# Patient Record
Sex: Female | Born: 1970 | Race: White | Hispanic: No | Smoking: Never smoker
Health system: Southern US, Community
[De-identification: ages and names within clinical notes are randomized; demographics above are authoritative.]

## PROBLEM LIST (undated history)

## (undated) DIAGNOSIS — F419 Anxiety disorder, unspecified: Secondary | ICD-10-CM

## (undated) DIAGNOSIS — M722 Plantar fascial fibromatosis: Secondary | ICD-10-CM

## (undated) DIAGNOSIS — F319 Bipolar disorder, unspecified: Secondary | ICD-10-CM

## (undated) DIAGNOSIS — F329 Major depressive disorder, single episode, unspecified: Secondary | ICD-10-CM

## (undated) DIAGNOSIS — F3130 Bipolar disorder, current episode depressed, mild or moderate severity, unspecified: Secondary | ICD-10-CM

## (undated) DIAGNOSIS — F32A Depression, unspecified: Secondary | ICD-10-CM

## (undated) DIAGNOSIS — Z0000 Encounter for general adult medical examination without abnormal findings: Secondary | ICD-10-CM

## (undated) DIAGNOSIS — T7840XA Allergy, unspecified, initial encounter: Secondary | ICD-10-CM

## (undated) HISTORY — DX: Major depressive disorder, single episode, unspecified: F32.9

## (undated) HISTORY — DX: Bipolar disorder, current episode depressed, mild or moderate severity, unspecified: F31.30

## (undated) HISTORY — DX: Depression, unspecified: F32.A

## (undated) HISTORY — DX: Allergy, unspecified, initial encounter: T78.40XA

## (undated) HISTORY — DX: Plantar fascial fibromatosis: M72.2

## (undated) HISTORY — PX: APPENDECTOMY: SHX54

## (undated) HISTORY — DX: Anxiety disorder, unspecified: F41.9

---
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
## 2011-01-10 ENCOUNTER — Emergency Department (HOSPITAL_COMMUNITY)
Admission: EM | Admit: 2011-01-10 | Discharge: 2011-01-10 | Disposition: A | Discharge status: Home / Self Care | Payer: PRIVATE HEALTH INSURANCE | Attending: Emergency Medicine | Admitting: Emergency Medicine

## 2011-01-10 DIAGNOSIS — F3289 Other specified depressive episodes: Secondary | ICD-10-CM | POA: Insufficient documentation

## 2011-01-10 DIAGNOSIS — R070 Pain in throat: Secondary | ICD-10-CM | POA: Insufficient documentation

## 2011-01-10 DIAGNOSIS — R599 Enlarged lymph nodes, unspecified: Secondary | ICD-10-CM | POA: Insufficient documentation

## 2011-01-10 DIAGNOSIS — F329 Major depressive disorder, single episode, unspecified: Secondary | ICD-10-CM | POA: Insufficient documentation

## 2011-01-10 DIAGNOSIS — K122 Cellulitis and abscess of mouth: Secondary | ICD-10-CM | POA: Insufficient documentation

## 2011-01-10 DIAGNOSIS — Z79899 Other long term (current) drug therapy: Secondary | ICD-10-CM | POA: Insufficient documentation

## 2011-01-10 LAB — RAPID STREP SCREEN (MED CTR MEBANE ONLY): Streptococcus, Group A Screen (Direct): NEGATIVE

## 2011-02-17 ENCOUNTER — Emergency Department (HOSPITAL_COMMUNITY)
Admission: EM | Admit: 2011-02-17 | Discharge: 2011-02-17 | Disposition: A | Discharge status: Other Acute Inpatient Hospital | Payer: PRIVATE HEALTH INSURANCE | Attending: Emergency Medicine | Admitting: Emergency Medicine

## 2011-02-17 ENCOUNTER — Inpatient Hospital Stay (HOSPITAL_COMMUNITY)
Admission: RE | Admit: 2011-02-17 | Discharge: 2011-02-20 | DRG: 885 | Disposition: A | Discharge status: Home / Self Care | Payer: PRIVATE HEALTH INSURANCE | Source: Ambulatory Visit | Attending: Psychiatry | Admitting: Psychiatry

## 2011-02-17 DIAGNOSIS — F1010 Alcohol abuse, uncomplicated: Secondary | ICD-10-CM

## 2011-02-17 DIAGNOSIS — Z9119 Patient's noncompliance with other medical treatment and regimen: Secondary | ICD-10-CM

## 2011-02-17 DIAGNOSIS — R45851 Suicidal ideations: Secondary | ICD-10-CM | POA: Insufficient documentation

## 2011-02-17 DIAGNOSIS — F3289 Other specified depressive episodes: Secondary | ICD-10-CM | POA: Insufficient documentation

## 2011-02-17 DIAGNOSIS — F331 Major depressive disorder, recurrent, moderate: Principal | ICD-10-CM

## 2011-02-17 DIAGNOSIS — J45909 Unspecified asthma, uncomplicated: Secondary | ICD-10-CM

## 2011-02-17 DIAGNOSIS — Z818 Family history of other mental and behavioral disorders: Secondary | ICD-10-CM

## 2011-02-17 DIAGNOSIS — Z91199 Patient's noncompliance with other medical treatment and regimen due to unspecified reason: Secondary | ICD-10-CM

## 2011-02-17 DIAGNOSIS — F329 Major depressive disorder, single episode, unspecified: Secondary | ICD-10-CM | POA: Insufficient documentation

## 2011-02-17 LAB — CBC
Hemoglobin: 14.1 g/dL (ref 12.0–15.0)
MCHC: 34.1 g/dL (ref 30.0–36.0)
RBC: 4.65 MIL/uL (ref 3.87–5.11)

## 2011-02-17 LAB — BASIC METABOLIC PANEL
CO2: 25 mEq/L (ref 19–32)
Calcium: 9.9 mg/dL (ref 8.4–10.5)
GFR calc non Af Amer: >60 mL/min (ref >60)
Sodium: 136 mEq/L (ref 135–145)

## 2011-02-17 LAB — URINALYSIS, ROUTINE W REFLEX MICROSCOPIC
Glucose, UA: NEGATIVE mg/dL
Leukocytes, UA: NEGATIVE
Nitrite: NEGATIVE
Protein, ur: NEGATIVE mg/dL
Urobilinogen, UA: 0.2 mg/dL (ref 0.0–1.0)

## 2011-02-17 LAB — ETHANOL: Alcohol, Ethyl (B): <11 mg/dL (ref 0–11)

## 2011-02-17 LAB — POCT PREGNANCY, URINE: Preg Test, Ur: NEGATIVE

## 2011-02-17 LAB — DIFFERENTIAL
Basophils Absolute: 0.1 10*3/uL (ref 0.0–0.1)
Basophils Relative: 1 % (ref 0–1)
Monocytes Absolute: 0.8 10*3/uL (ref 0.1–1.0)
Neutro Abs: 6.2 10*3/uL (ref 1.7–7.7)
Neutrophils Relative %: 61 % (ref 43–77)

## 2011-02-18 DIAGNOSIS — F329 Major depressive disorder, single episode, unspecified: Secondary | ICD-10-CM

## 2011-02-18 DIAGNOSIS — F1010 Alcohol abuse, uncomplicated: Secondary | ICD-10-CM

## 2011-02-21 NOTE — Discharge Summary (Signed)
NAME:  Selena Chapman, Selena Chapman      ACCOUNT NO.:  [id number]  MEDICAL RECORD NO.:  [id number]  LOCATION:  0306                          FACILITY:  [facility]  PHYSICIAN:  Debbora Lacrosse, MD       DATE OF BIRTH:  Mar 20, 1971  DATE OF ADMISSION:  02/17/2011 DATE OF DISCHARGE:  02/20/2011                              DISCHARGE SUMMARY   REASON FOR ADMISSION:  This is a 40 year old who was voluntarily admitted with an onset of depression, worsening since she was intermittently using medications and had also been using alcohol heavily.  Was drinking more since the anniversary of her sister's death, which was in 2023-01-06, and has been drinking most days.  She states on poor days she would drink 4 or 5 drinks.  She states she did have a problem with alcohol in the past 5 years and was doing well again up until this past year.  Sleep had been erratic, finding no joy in things, and did not feel like her medications were working well, but also reports intermittent compliance.  FINAL DIAGNOSES:  Axis I.  Major depressive disorder, recurrent, moderate.  Alcohol abuse. Axis II.  Deferred. Axis III.  History of asthma. Axis IV.  Problems with psychosocial including support and anniversary of her sister's death from an overdose. Axis V.  50-55.  LABS:  CBC was normal.  Alcohol level was negative.  BMP normal. Urinalysis normal.  Urine pregnancy negative.  Drug screen negative.  COURSE IN HOSPITAL:  Patient was admitted to the inpatient unit on February 17, 2011, and has had improvements during this hospitalization.  She participated well in group and individual treatment, was insightful into her illness.  Initially felt uncomfortable being here, as this was her first hospitalization, but did do well on this inpatient unit.  She resumed consistently taking her Zoloft and Wellbutrin, which had worked for her in the past.  This regimen was discussed with her and she felt that overall it had been successful, if  she would remember to take these daily.  She also discussed her alcohol use and how that had affected her medication compliance.  She has a supportive husband and family, and she is also interested in further treatment in and intensive outpatient setting and some substance treatment as well.  She has previously been to the [facility] and plans to resume treatment there, which is close to her residence.  The patient has improved.  She is sleeping well.  She did take some Vistaril for sleep.  She denies having any suicidal thinking.  No homicidal thinking, no hallucinations or other psychotic symptoms.  Her insight is present.  Judgement is good.  She appears to be stable for discharge to outpatient treatment.  Collateral information was obtained by the counsellor from her husband, who is also quite supportive in her coming home, as he has seen her in the hospital and feels that she is stable as well.  FOLLOWUP PLAN:  The patient will follow up at the [facility] and has an appointment in 2 days.  DISCHARGE MEDICATIONS: 1. Wellbutrin XL 300 mg daily. 2. Sertraline 200 mg daily.  DISCHARGE CONDITION:  Safe, stable, improved.  ______________________________ Debbora Lacrosse, MD     WS/MEDQ  D:  02/20/2011  T:  02/20/2011  Job:  [id number]  Electronically Signed by Andi Devon Alvera Tourigny  on 02/21/2011 08:34:25 AM

## 2011-02-22 NOTE — H&P (Signed)
NAME:  Selena Chapman, Selena Chapman    ACCOUNT NO.:  [id number]  MEDICAL RECORD NO.:  [id number]  LOCATION:                                 FACILITY:  PHYSICIAN:  Debbora Lacrosse, MD       DATE OF BIRTH:  1971-03-15  DATE OF ADMISSION: DATE OF DISCHARGE:                      PSYCHIATRIC ADMISSION ASSESSMENT   This is on a 40 year old female voluntarily admitted February 17, 2011.  HISTORY OF PRESENT ILLNESS:  The patient presents with a long history of depression and alcohol use.  She states that she has been drinking more since the anniversary of her sister's death which was in 2023-01-28. She has been drinking most days,  starting in the afternoon.  She states that on a low day, she will drink four to five,  others she will drink "a lot." She has been drinking fairly steadily for about a year,  did have a problem with alcohol in the past about 5 years ago and was doing well again up until this past year.  Her sleep has been erratic.  She finds no joy in things.  Does not feel like her medications are working well for her,  but she does report some noncompliance as well.  PAST PSYCHIATRIC HISTORY:  First admission to the [facility].  She sees Dr. Donell Beers for outpatient psychiatry,  has been on Lexapro in the past.  SOCIAL HISTORY:  The patient is married, a  40 year old. She has a 56- year-old child.  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  She lives in [place].  FAMILY HISTORY:  Sister committed suicide by overdose 2 years ago.  Her sister was 65 years of age.  Her mother has a history of depression currently on Celexa.  ALCOHOL DRUG HISTORY:  As above.  No history of seizures or blackouts and denies any other substance use.  Her primary care provider is Dr. Nadyne Coombes.  PAST MEDICAL HISTORY:  History of asthma which she states is in control.  MEDICATIONS:  Has been on Wellbutrin 300 mg.  She tapered herself down from 450 as she was feeling very jittery.  Zoloft 200 mg daily which she has been on since January 28, 2008 and Klonopin at bedtime.  Again reporting some recent noncompliance.  DRUG ALLERGIES:  No known allergies.  PHYSICAL EXAMINATION:  GENERAL APPEARANCE:  Physical exam was done in the emergency department.  Normally-developed well-nourished female in no acute distress.  LABORATORY FINDINGS:  Alcohol level less than 11.  CBC within normal limits.  Pregnancy test is negative.  Urinalysis negative.  Drug screen is negative.  BMET within normal limits.  MENTAL STATUS EXAM:  She is fully alert and cooperative, sitting on her bed.  Speech is soft-spoken and she provides a good history to her circumstances and symptoms.  Thought processes are coherent, goal directed.  Denies any suicidal thoughts and feels this is a stressful place for her at this time, asking about going home.  Denies any suicidal or homicidal thoughts, function intact.  Her memory is intact. Judgment insight are fair.  IMPRESSION:  AXIS I:  Major depressive disorder, moderate,  rule out alcohol abuse. AXIS II:  Deferred. AXIS III:  History of asthma. AXIS IV:  Other psychosocial problems. AXIS V:  45.  PLAN:  Our plan is to continue Zoloft and Wellbutrin. We will  address her alcohol use.  The patient would benefit from some individual counseling which she is also in agreement with.  Tentative length of stay 3-four days.     Landry Corporal, N.P.   ______________________________ Debbora Lacrosse, MD    JO/MEDQ  D:  02/18/2011  T:  02/18/2011  Job:  [id number]  Electronically Signed by Limmie PatriciaP. on 02/21/2011 01:45:37 PM Electronically Signed by Andi Devon Angas Isabell  on 02/22/2011 08:16:48 AM

## 2011-09-23 ENCOUNTER — Encounter: Payer: Self-pay | Admitting: Family Medicine

## 2011-09-23 ENCOUNTER — Ambulatory Visit (INDEPENDENT_AMBULATORY_CARE_PROVIDER_SITE_OTHER): Payer: Managed Care, Other (non HMO) | Admitting: Family Medicine

## 2011-09-23 DIAGNOSIS — J45909 Unspecified asthma, uncomplicated: Secondary | ICD-10-CM | POA: Insufficient documentation

## 2011-09-23 DIAGNOSIS — Z23 Encounter for immunization: Secondary | ICD-10-CM

## 2011-09-23 DIAGNOSIS — F329 Major depressive disorder, single episode, unspecified: Secondary | ICD-10-CM | POA: Insufficient documentation

## 2011-09-23 DIAGNOSIS — F32A Depression, unspecified: Secondary | ICD-10-CM | POA: Insufficient documentation

## 2011-09-23 DIAGNOSIS — Z9109 Other allergy status, other than to drugs and biological substances: Secondary | ICD-10-CM

## 2011-12-16 ENCOUNTER — Ambulatory Visit: Payer: Managed Care, Other (non HMO)

## 2011-12-16 ENCOUNTER — Ambulatory Visit (INDEPENDENT_AMBULATORY_CARE_PROVIDER_SITE_OTHER): Payer: Managed Care, Other (non HMO) | Admitting: Family Medicine

## 2011-12-16 VITALS — BP 124/84 | HR 67 | Temp 98.0°F | Resp 20 | Ht 62.0 in | Wt 209.4 lb

## 2011-12-16 DIAGNOSIS — M79609 Pain in unspecified limb: Secondary | ICD-10-CM

## 2011-12-16 DIAGNOSIS — M79646 Pain in unspecified finger(s): Secondary | ICD-10-CM

## 2011-12-16 DIAGNOSIS — S61209A Unspecified open wound of unspecified finger without damage to nail, initial encounter: Secondary | ICD-10-CM

## 2011-12-16 MED ORDER — TETANUS-DIPHTH-ACELL PERTUSSIS 5-2.5-18.5 LF-MCG/0.5 IM SUSP: 0.5000 mL | Freq: Once | INTRAMUSCULAR | Status: DC

## 2011-12-16 NOTE — Patient Instructions (Signed)
Wound Care Wound care helps prevent pain and infection.  You may need a tetanus shot if:  You cannot remember when you had your last tetanus shot.   You have never had a tetanus shot.   The injury broke your skin.  If you need a tetanus shot and you choose not to have one, you may get tetanus. Sickness from tetanus can be serious. HOME CARE   Only take medicine as told by your doctor.   Clean the wound daily with mild soap and water.   Change any bandages (dressings) as told by your doctor.   Put medicated cream and a bandage on the wound as told by your doctor.   Change the bandage if it gets wet, dirty, or starts to smell.   Take showers. Do not take baths, swim, or do anything that puts your wound under water.   Rest and raise (elevate) the wound until the pain and puffiness (swelling) are better.   Keep all doctor visits as told.  GET HELP RIGHT AWAY IF:   Yellowish-white fluid (pus) comes from the wound.   Medicine does not lessen your pain.   There is a red streak going away from the wound.   You cannot move your finger or toe.   You have a fever.  MAKE SURE YOU:   Understand these instructions.   Will watch your condition.   Will get help right away if you are not doing well or get worse.  Document Released: 05/21/2008 Document Revised: 08/01/2011 Document Reviewed: 12/16/2010 ExitCare Patient Information 2012 [employer]. 

## 2011-12-16 NOTE — Progress Notes (Signed)
Subjective: Estimating the patient was closing a window at her house. The heavy old window came down and smashed her right third and fourth fingers. She had to get her husband to help open the window move her fingers. She had abrasions on both fingers. The fourth finger had a couple of abrasions around the DIP joint which were somewhat macerated looking. The middle finger has a C-shaped skin flap fitted peelback but came down nicely into place and looks intact.   Objective: Wounds are fingers as noted above. There is decreased flexion of her DIP joints, consistent with the swelling that has taken place in the fingertips. The right fourth is that he worse of the 2 fingers. Dissection a small abrasion on the index finger also.  Assessment: Wounds of fingertips right hand fingers 2-4.  Plan: X-ray fingers. Cleaned the wounds gently and debrided a little bit of dead skin off the fourth finger. After the x-ray will dress the fingers. Her last tetanus shot was 09/22/05 so will get TDap  [facility] reading (PRIMARY) by  Dr. Mia Creek x-ray normal no fracture seen.  Will dress fingers and given instruction in care.Marland Kitchen

## 2012-01-20 ENCOUNTER — Ambulatory Visit (INDEPENDENT_AMBULATORY_CARE_PROVIDER_SITE_OTHER): Payer: Managed Care, Other (non HMO) | Admitting: Family Medicine

## 2012-01-20 VITALS — BP 118/79 | HR 69 | Temp 98.3°F | Resp 16 | Ht 62.0 in | Wt 208.6 lb

## 2012-01-20 DIAGNOSIS — S0180XA Unspecified open wound of other part of head, initial encounter: Secondary | ICD-10-CM

## 2012-01-20 NOTE — Progress Notes (Signed)
Subjective: Patient bumped her head against some landscaping when she fell yesterday evening. She has a wound above her left eyebrow. Her tetanus shot is up-to-date, just having had it in recent weeks. No loss of consciousness  Objective: 1.5 CM laceration at the end of her left eyebrow, a clean cut.  Assessment: Wound eyebrow  Plan: Laceration repair

## 2012-01-20 NOTE — Progress Notes (Signed)
Verbal consent obtained from the patient.  Local anesthesia with 1cc Lidocaine 2% with epinephrine.  Wound scrubbed with soap and water and rinsed.  Wound closed with 4 5-0 Prolene HM and SI sutures.  Wound cleansed and dressed.

## 2012-01-20 NOTE — Patient Instructions (Signed)

## 2012-01-27 ENCOUNTER — Ambulatory Visit (INDEPENDENT_AMBULATORY_CARE_PROVIDER_SITE_OTHER): Payer: Managed Care, Other (non HMO) | Admitting: Family Medicine

## 2012-01-27 DIAGNOSIS — Z4802 Encounter for removal of sutures: Secondary | ICD-10-CM

## 2012-01-27 NOTE — Patient Instructions (Signed)
Suture Removal  Your caregiver has removed your sutures today. If skin adhesive strips were applied at the time of suturing, or applied following removal of the sutures today, they will begin to peel off in a couple more days. If skin adhesive strips remain after 14 days, they may be removed.  HOME CARE INSTRUCTIONS     Change any bandages (dressings) at least once a day or as directed by your caregiver. If the bandage sticks, soak it off with warm, soapy water.    Wash the area with soap and water to remove all the cream or ointment (if you were instructed to use any) 2 times a day. Rinse off the soap and pat the area dry with a clean towel.    Reapply cream or ointment as directed by your caregiver. This will help prevent infection and keep the bandage from sticking.    Keep the wound area dry and clean. If the bandage becomes wet, dirty, or develops a bad smell, change it as soon as possible.    Only take over-the-counter or prescription medicines for pain, discomfort, or fever as directed by your caregiver.    Use sunscreen when out in the sun. New scars become sunburned easily.    Return to your caregivers office in in 7 days or as directed to have your sutures removed.   You may need a tetanus shot if:   You cannot remember when you had your last tetanus shot.    You have never had a tetanus shot.    The injury broke your skin.   If you got a tetanus shot, your arm may swell, get red, and feel warm to the touch. This is common and not a problem. If you need a tetanus shot and you choose not to have one, there is a rare chance of getting tetanus. Sickness from tetanus can be serious.  SEEK IMMEDIATE MEDICAL CARE IF:     There is redness, swelling, or increasing pain in the wound.    Pus is coming from the wound.    An unexplained oral temperature above 102 F (38.9 C) develops.    You notice a bad smell coming from the wound or dressing.     The wound breaks open (edges not staying together) after sutures have been removed.   Document Released: 05/07/2001 Document Revised: 08/01/2011 Document Reviewed: 07/06/2007  ExitCare Patient Information 2012 [employer].

## 2012-01-27 NOTE — Progress Notes (Signed)
@  [email or web address]@  Patient ID: Selena Chapman MRN: [id number], DOB: [birth year] 41 y.o. Date of Encounter: 01/27/2012, 10:38 AM  Primary Physician: No primary provider on file.  Chief Complaint: Suture removal    See note from May 27  HPI: 41 y.o. y/o female with injury to left upper orbit Here for suture removal s/p placement on 5/27 Doing well No issues/complaints Afebrile/ No chills No erythema No pain Able to move without difficulty Normal sensation  Past Medical History  Diagnosis Date  . Depression   . Asthma   . Allergy      Home Meds: Prior to Admission medications   Medication Sig Start Date End Date Taking? Authorizing Provider  albuterol (PROVENTIL HFA;VENTOLIN HFA) 108 (90 BASE) MCG/ACT inhaler Inhale 2 puffs into the lungs every 6 (six) hours as needed.    Dois Davenport, MD  buPROPion (WELLBUTRIN XL) 300 MG 24 hr tablet Take 300 mg by mouth daily.    Archer Asa, MD  fluticasone (FLONASE) 50 MCG/ACT nasal spray Place 2 sprays into the nose daily.    Dois Davenport, MD  montelukast (SINGULAIR) 10 MG tablet Take 10 mg by mouth at bedtime.    Dois Davenport, MD  sertraline (ZOLOFT) 100 MG tablet 1 po bid    Archer Asa, MD    Allergies: No Known Allergies  Physical Exam:\ Last menstrual period 12/30/2011., There is no height or weight on file to calculate BMI. General: Well developed, well nourished, in no acute distress. Head: Normocephalic, atraumatic, sclera non-icteric, no xanthomas, nares are without discharge.  Neck: Supple. Lungs: Breathing is unlabored. Heart: Normal rate. Msk:  Strength and tone appear normal for age. Wound: 1 cm Wound well health without erythema, swelling, or tenderness to palpation. FROM and 5/5 strength with normal sensation throughout including 2 point discrimination Skin: See above, otherwise dry without rash or erythema. Extremities: No clubbing or cyanosis. No edema. Neuro: Alert and oriented X 3. Moves all  extremities spontaneously.  Psych:  Responds to questions appropriately with a normal affect.   PROCEDURE: Verbal consent obtained. 4 sutures removed without difficulty.  Assessment and Plan: 41 y.o. y/o female here for suture removal for wound described above. -Sutures removed per above -Wound resolved -RTC prn  Signed, Elvina Sidle, MD 01/27/2012 10:38 AM

## 2012-11-09 ENCOUNTER — Other Ambulatory Visit: Payer: Self-pay

## 2012-11-09 MED ORDER — ALBUTEROL SULFATE HFA 108 (90 BASE) MCG/ACT IN AERS: 2.0000 | INHALATION_SPRAY | Freq: Four times a day (QID) | RESPIRATORY_TRACT | Status: DC | PRN

## 2013-01-22 ENCOUNTER — Encounter: Payer: Managed Care, Other (non HMO) | Admitting: Family Medicine

## 2013-05-20 ENCOUNTER — Ambulatory Visit (INDEPENDENT_AMBULATORY_CARE_PROVIDER_SITE_OTHER): Payer: BC Managed Care – PPO | Admitting: Family Medicine

## 2013-05-20 VITALS — BP 126/80 | HR 75 | Temp 99.3°F | Resp 17 | Ht 62.5 in | Wt 202.0 lb

## 2013-05-20 DIAGNOSIS — Z23 Encounter for immunization: Secondary | ICD-10-CM

## 2013-05-20 DIAGNOSIS — Z01419 Encounter for gynecological examination (general) (routine) without abnormal findings: Secondary | ICD-10-CM

## 2013-05-20 DIAGNOSIS — Z3009 Encounter for other general counseling and advice on contraception: Secondary | ICD-10-CM

## 2013-05-20 DIAGNOSIS — Z0000 Encounter for general adult medical examination without abnormal findings: Secondary | ICD-10-CM

## 2013-05-20 LAB — POCT UA - MICROSCOPIC ONLY
Bacteria, U Microscopic: NEGATIVE
Mucus, UA: NEGATIVE
RBC, urine, microscopic: NEGATIVE
WBC, Ur, HPF, POC: NEGATIVE

## 2013-05-20 LAB — POCT URINALYSIS DIPSTICK
Bilirubin, UA: NEGATIVE
Blood, UA: NEGATIVE
Ketones, UA: NEGATIVE
pH, UA: 7

## 2013-05-20 LAB — POCT WET PREP WITH KOH: RBC Wet Prep HPF POC: NEGATIVE

## 2013-05-20 MED ORDER — FLUTICASONE PROPIONATE 50 MCG/ACT NA SUSP: 2.0000 | Freq: Every day | NASAL | Status: DC

## 2013-05-20 MED ORDER — ALBUTEROL SULFATE HFA 108 (90 BASE) MCG/ACT IN AERS: 2.0000 | INHALATION_SPRAY | RESPIRATORY_TRACT | Status: DC | PRN

## 2013-05-20 MED ORDER — NORETHIN ACE-ETH ESTRAD-FE 1.5-30 MG-MCG PO TABS: 1.0000 | ORAL_TABLET | Freq: Every day | ORAL | Status: DC

## 2013-05-20 NOTE — Progress Notes (Signed)
Subjective:    Patient ID: Selena Chapman, female    DOB: 10-09-1970, 42 y.o.   MRN: [id number] Chief Complaint  Patient presents with  . Follow-up    patient is requesting Birth control    HPI  NO h/o abnml pap smears.  Has a little cyst/lump on her left upper inner labia minora but has stayed the same for yrs.  No vag d/c.  Currently using condoms for birth control.  Used the pill in the past but doesn't want to be on that due to age. Also used sponge and condoms in the past.   Stopped smoking 1 1/2 yrs ago - smoking socially before.  Was on ortho-sept OCP previously.  Periods are really regular - only last 5d - first day very heavy then gradually tapers off.  Occasional seasonal asthma also well treated with prn albuterol and flonase  Prev pt of Dr. Lenord Fellers.   Recently seperated from her husband. Now dating again and sexually active in a new monogamous relationship, using condoms every time.  Past Medical History  Diagnosis Date  . Depression   . Asthma   . Allergy    History reviewed. No pertinent past surgical history. Current Outpatient Prescriptions on File Prior to Visit  Medication Sig Dispense Refill  . buPROPion (WELLBUTRIN XL) 300 MG 24 hr tablet Take 300 mg by mouth daily.      . sertraline (ZOLOFT) 100 MG tablet 1 po bid       Current Facility-Administered Medications on File Prior to Visit  Medication Dose Route Frequency Provider Last Rate Last Dose  . TDaP (BOOSTRIX) injection 0.5 mL  0.5 mL Intramuscular Once Peyton Najjar, MD       No Known Allergies Family History  Problem Relation Age of Onset  . Hypertension Mother   . Depression Sister    History   Social History  . Marital Status: Married    Spouse Name: N/A    Number of Children: N/A  . Years of Education: N/A   Social History Main Topics  . Smoking status: Never Smoker   . Smokeless tobacco: None  . Alcohol Use: No  . Drug Use: No  . Sexual Activity: Yes    Birth Control/  Protection: Condom   Other Topics Concern  . None   Social History Narrative  . None       Review of Systems  All other systems reviewed and are negative.      BP 126/80  Pulse 75  Temp(Src) 99.3 F (37.4 C) (Oral)  Resp 17  Ht 5' 2.5" (1.588 m)  Wt 202 lb (91.627 kg)  BMI 36.33 kg/m2  SpO2 96% Objective:   Physical Exam  Constitutional: She is oriented to person, place, and time. She appears well-developed and well-nourished. No distress.  HENT:  Head: Normocephalic and atraumatic.  Right Ear: Tympanic membrane, external ear and ear canal normal.  Left Ear: Tympanic membrane, external ear and ear canal normal.  Nose: No mucosal edema or rhinorrhea.  Mouth/Throat: Uvula is midline, oropharynx is clear and moist and mucous membranes are normal. No posterior oropharyngeal erythema.  Eyes: Conjunctivae and EOM are normal. Pupils are equal, round, and reactive to light. Right eye exhibits no discharge. Left eye exhibits no discharge. No scleral icterus.  Neck: Normal range of motion. Neck supple. No thyromegaly present.  Cardiovascular: Normal rate, regular rhythm, normal heart sounds and intact distal pulses.   Pulmonary/Chest: Effort normal and breath sounds normal. No  respiratory distress.  Abdominal: Soft. Bowel sounds are normal. There is no tenderness.  Genitourinary: Uterus normal. No breast swelling, tenderness, discharge or bleeding. Cervix exhibits no motion tenderness and no friability. Right adnexum displays no mass and no tenderness. Left adnexum displays no mass and no tenderness. Vaginal discharge found.  Uterus anteverted so posterior lip of cervix not visualized  Musculoskeletal: She exhibits no edema.  Lymphadenopathy:    She has no cervical adenopathy.  Neurological: She is alert and oriented to person, place, and time. She has normal reflexes.  Skin: Skin is warm and dry. She is not diaphoretic. No erythema.  Psychiatric: She has a normal mood and affect.  Her behavior is normal.          Results for orders placed in visit on 05/20/13  POCT UA - MICROSCOPIC ONLY      Result Value Range   WBC, Ur, HPF, POC neg     RBC, urine, microscopic neg     Bacteria, U Microscopic neg     Mucus, UA neg     Epithelial cells, urine per micros 0-1     Crystals, Ur, HPF, POC neg     Casts, Ur, LPF, POC neg     Yeast, UA neg    POCT URINALYSIS DIPSTICK      Result Value Range   Color, UA yellow     Clarity, UA clear     Glucose, UA neg     Bilirubin, UA neg     Ketones, UA neg     Spec Grav, UA 1.010     Blood, UA neg     pH, UA 7.0     Protein, UA neg     Urobilinogen, UA 0.2     Nitrite, UA neg     Leukocytes, UA Negative    POCT URINE PREGNANCY      Result Value Range   Preg Test, Ur Negative    POCT WET PREP WITH KOH      Result Value Range   Trichomonas, UA Negative     Clue Cells Wet Prep HPF POC 1-3     Epithelial Wet Prep HPF POC 1-8     Yeast Wet Prep HPF POC neg     Bacteria Wet Prep HPF POC 2+     RBC Wet Prep HPF POC neg     WBC Wet Prep HPF POC 2-5     KOH Prep POC Negative     Assessment & Plan:  Routine general medical examination at a health care facility - Plan: POCT UA - Microscopic Only, POCT urinalysis dipstick, POCT Wet Prep with KOH, POCT CBC, Comprehensive metabolic panel, Lipid panel, TSH, HSV(herpes simplex vrs) 1+2 ab-IgG, HIV antibody, Hepatitis C antibody, RPR, CANCELED: GC/Chlamydia Probe  - Not fasting today so will put in future orders for all serum labs - pt can RTC at her convenience fasting to receive.  Routine gynecological examination - Plan: Pap IG, CT/NG NAA, and HPV (high risk), HSV(herpes simplex vrs) 1+2 ab-IgG, HIV antibody, Hepatitis C antibody, RPR, CANCELED: GC/Chlamydia Probe Amp.  Did not discuss baseline screening mammogram but now 42 yo so discuss at f/u.  Family planning - Plan: POCT urine pregnancy, Pap IG, CT/NG NAA, and HPV (high risk). Pt planning to establish with gyn to get IUD.   Will try OCPs until then - monophasic pill so she can do back-to-back pill packs and skip a menses if desired.  Need for prophylactic vaccination and  inoculation against influenza - Plan: Flu Vaccine QUAD 36+ mos IM  Meds ordered this encounter  Medications  . fluticasone (FLONASE) 50 MCG/ACT nasal spray    Sig: Place 2 sprays into the nose daily.    Dispense:  16 g    Refill:  11  . albuterol (PROVENTIL HFA;VENTOLIN HFA) 108 (90 BASE) MCG/ACT inhaler    Sig: Inhale 2 puffs into the lungs every 4 (four) hours as needed for wheezing. PATIENT NEEDS OFFICE VISIT FOR ADD'L REFILLS    Dispense:  2 Inhaler    Refill:  12    Fill w/Pro-Air  . norethindrone-ethinyl estradiol-iron (MICROGESTIN FE,GILDESS FE,LOESTRIN FE) 1.5-30 MG-MCG tablet    Sig: Take 1 tablet by mouth daily.    Dispense:  3 Package    Refill:  5

## 2013-05-20 NOTE — Patient Instructions (Addendum)

## 2013-05-24 LAB — PAP IG, CT-NG NAA, HPV HIGH-RISK: Chlamydia Probe Amp: NEGATIVE

## 2013-10-09 ENCOUNTER — Other Ambulatory Visit: Payer: Self-pay | Admitting: Family Medicine

## 2013-11-06 ENCOUNTER — Ambulatory Visit (INDEPENDENT_AMBULATORY_CARE_PROVIDER_SITE_OTHER): Payer: BC Managed Care – PPO | Admitting: Physician Assistant

## 2013-11-06 VITALS — BP 114/68 | HR 76 | Temp 99.2°F | Resp 16 | Ht 62.5 in | Wt 208.6 lb

## 2013-11-06 DIAGNOSIS — R059 Cough, unspecified: Secondary | ICD-10-CM

## 2013-11-06 DIAGNOSIS — R05 Cough: Secondary | ICD-10-CM

## 2013-11-06 DIAGNOSIS — J019 Acute sinusitis, unspecified: Secondary | ICD-10-CM

## 2013-11-06 MED ORDER — HYDROCOD POLST-CHLORPHEN POLST 10-8 MG/5ML PO LQCR: 5.0000 mL | Freq: Two times a day (BID) | ORAL | Status: AC | PRN

## 2013-11-06 MED ORDER — IPRATROPIUM BROMIDE 0.03 % NA SOLN: 2.0000 | Freq: Two times a day (BID) | NASAL | Status: DC

## 2013-11-06 MED ORDER — AMOXICILLIN 875 MG PO TABS: 1750.0000 mg | ORAL_TABLET | Freq: Two times a day (BID) | ORAL | Status: DC

## 2013-11-06 NOTE — Progress Notes (Signed)
   Subjective:    Patient ID: Selena Chapman, female    DOB: June 02, 1971, 43 y.o.   MRN: [id number]  HPI Patient presents with symptoms for one week of sinus pressure, headache, green nasal drainage, post nasal drip, sneezing, cough productive of green sputum.  She reports a history of sinus infections, usually once per year, usually in March.  This illness started with allergy and asthma symptoms which has resolved.  She reports fever, chills, fatigue, and loss of taste for food.  She denies sweats, eye discharge, and ear pain.  Review of Systems  Constitutional: Positive for fever, chills and fatigue. Negative for diaphoresis.  HENT: Positive for postnasal drip, rhinorrhea (green), sinus pressure and sneezing. Negative for congestion, ear pain and sore throat.   Eyes: Negative for discharge.  Respiratory: Positive for cough (productive of green sputum). Negative for shortness of breath and wheezing.   Musculoskeletal: Negative for myalgias.      Objective:   Physical Exam  Constitutional: She is oriented to person, place, and time. She appears well-developed and well-nourished. No distress.  HENT:  Head: Normocephalic and atraumatic.  Right Ear: Tympanic membrane normal.  Left Ear: Tympanic membrane normal.  Nose: Mucosal edema and rhinorrhea (green) present. Right sinus exhibits maxillary sinus tenderness and frontal sinus tenderness. Left sinus exhibits maxillary sinus tenderness and frontal sinus tenderness.  Mouth/Throat: Oropharynx is clear and moist. No oropharyngeal exudate.  Eyes: Conjunctivae and EOM are normal. Pupils are equal, round, and reactive to light. Right eye exhibits no discharge. Left eye exhibits no discharge. No scleral icterus.  Neck: Normal range of motion. Neck supple. No thyromegaly present.  Cardiovascular: Normal rate, regular rhythm and normal heart sounds.  Exam reveals no gallop.   No murmur heard. Pulmonary/Chest: Effort normal and breath sounds  normal. No respiratory distress. She has no wheezes. She has no rhonchi. She has no rales.  Lymphadenopathy:       Head (right side): No submandibular, no tonsillar, no preauricular, no posterior auricular and no occipital adenopathy present.       Head (left side): No submandibular, no tonsillar, no preauricular, no posterior auricular and no occipital adenopathy present.    She has no cervical adenopathy.  Reports tenderness at tonsillar lymph nodes  Neurological: She is alert and oriented to person, place, and time.  Skin: Skin is warm and dry. She is not diaphoretic.  Psychiatric: She has a normal mood and affect. Her behavior is normal. Judgment and thought content normal.      Assessment & Plan:   1. Sinusitis, acute Treat symptoms. - ipratropium (ATROVENT) 0.03 % nasal spray; Place 2 sprays into both nostrils 2 (two) times daily.  Dispense: 30 mL; Refill: 0 - amoxicillin (AMOXIL) 875 MG tablet; Take 2 tablets (1,750 mg total) by mouth 2 (two) times daily.  Dispense: 20 tablet; Refill: 0  2. Cough Likely due to post nasal drip. - chlorpheniramine-HYDROcodone (TUSSIONEX PENNKINETIC ER) 10-8 MG/5ML LQCR; Take 5 mLs by mouth every 12 (twelve) hours as needed for cough (cough).  Dispense: 100 mL; Refill: 0

## 2013-11-06 NOTE — Patient Instructions (Signed)
Take claritin or zyrtec 1 tab daily.  Take mucinex extra strength as per box directions.  Sinusitis Sinusitis is redness, soreness, and swelling (inflammation) of the paranasal sinuses. Paranasal sinuses are air pockets within the bones of your face (beneath the eyes, the middle of the forehead, or above the eyes). In healthy paranasal sinuses, mucus is able to drain out, and air is able to circulate through them by way of your nose. However, when your paranasal sinuses are inflamed, mucus and air can become trapped. This can allow bacteria and other germs to grow and cause infection. Sinusitis can develop quickly and last only a short time (acute) or continue over a long period (chronic). Sinusitis that lasts for more than 12 weeks is considered chronic.  CAUSES  Causes of sinusitis include:  Allergies.  Structural abnormalities, such as displacement of the cartilage that separates your nostrils (deviated septum), which can decrease the air flow through your nose and sinuses and affect sinus drainage.  Functional abnormalities, such as when the small hairs (cilia) that line your sinuses and help remove mucus do not work properly or are not present. SYMPTOMS  Symptoms of acute and chronic sinusitis are the same. The primary symptoms are pain and pressure around the affected sinuses. Other symptoms include:  Upper toothache.  Earache.  Headache.  Bad breath.  Decreased sense of smell and taste.  A cough, which worsens when you are lying flat.  Fatigue.  Fever.  Thick drainage from your nose, which often is green and may contain pus (purulent).  Swelling and warmth over the affected sinuses. DIAGNOSIS  Your caregiver will perform a physical exam. During the exam, your caregiver may:  Look in your nose for signs of abnormal growths in your nostrils (nasal polyps).  Tap over the affected sinus to check for signs of infection.  View the inside of your sinuses (endoscopy) with a  special imaging device with a light attached (endoscope), which is inserted into your sinuses. If your caregiver suspects that you have chronic sinusitis, one or more of the following tests may be recommended:  Allergy tests.  Nasal culture A sample of mucus is taken from your nose and sent to a lab and screened for bacteria.  Nasal cytology A sample of mucus is taken from your nose and examined by your caregiver to determine if your sinusitis is related to an allergy. TREATMENT  Most cases of acute sinusitis are related to a viral infection and will resolve on their own within 10 days. Sometimes medicines are prescribed to help relieve symptoms (pain medicine, decongestants, nasal steroid sprays, or saline sprays).  However, for sinusitis related to a bacterial infection, your caregiver will prescribe antibiotic medicines. These are medicines that will help kill the bacteria causing the infection.  Rarely, sinusitis is caused by a fungal infection. In theses cases, your caregiver will prescribe antifungal medicine. For some cases of chronic sinusitis, surgery is needed. Generally, these are cases in which sinusitis recurs more than 3 times per year, despite other treatments. HOME CARE INSTRUCTIONS   Drink plenty of water. Water helps thin the mucus so your sinuses can drain more easily.  Use a humidifier.  Inhale steam 3 to 4 times a day (for example, sit in the bathroom with the shower running).  Apply a warm, moist washcloth to your face 3 to 4 times a day, or as directed by your caregiver.  Use saline nasal sprays to help moisten and clean your sinuses.  Take over-the-counter  or prescription medicines for pain, discomfort, or fever only as directed by your caregiver. SEEK IMMEDIATE MEDICAL CARE IF:  You have increasing pain or severe headaches.  You have nausea, vomiting, or drowsiness.  You have swelling around your face.  You have vision problems.  You have a stiff  neck.  You have difficulty breathing. MAKE SURE YOU:   Understand these instructions.  Will watch your condition.  Will get help right away if you are not doing well or get worse. Document Released: 08/12/2005 Document Revised: 11/04/2011 Document Reviewed: 08/27/2011 [facility] Patient Information 2014 [place], [place].

## 2013-11-06 NOTE — Progress Notes (Signed)
I have examined this patient along with the student and agree.

## 2013-11-13 ENCOUNTER — Telehealth: Payer: Self-pay

## 2013-11-13 NOTE — Telephone Encounter (Signed)
Patient called stated she came in last week with sinus infection. She said Amoxacillin 875 mg 2 tabs by mouth daily isnt working, she was wondering if she can try another antibiotic.

## 2013-11-14 ENCOUNTER — Ambulatory Visit (INDEPENDENT_AMBULATORY_CARE_PROVIDER_SITE_OTHER): Payer: BC Managed Care – PPO | Admitting: Family Medicine

## 2013-11-14 VITALS — BP 140/92 | HR 70 | Temp 98.2°F | Resp 18 | Wt 211.0 lb

## 2013-11-14 DIAGNOSIS — J329 Chronic sinusitis, unspecified: Secondary | ICD-10-CM

## 2013-11-14 MED ORDER — LEVOFLOXACIN 500 MG PO TABS: 500.0000 mg | ORAL_TABLET | Freq: Every day | ORAL | Status: DC

## 2013-11-14 NOTE — Progress Notes (Signed)
This chart was scribed for Robyn Haber, MD by Roxan Diesel, ED scribe.  This patient was seen in [facility] and the patient's care was started at 10:11 AM.  @[facility]OGO @   Patient ID: Selena Chapman MRN: [id number], DOB: 09/18/1970, 43 y.o. Date of Encounter: 11/14/2013, 10:10 AM  Primary Physician: Delman Cheadle, MD  Chief Complaint  Patient presents with   Sinusitis    HPI: 43 y.o. year old female presents with a 2-week history of nasal congestion, post nasal drip, sore throat, sinus pressure, and cough.  Pt here approximately 1 week ago and diagnosed with a sinus infection.  She was placed on amoxicillin 2x/day.  She states that her symptoms have persisted and she has had only minimal improvement if any.  She reports she has continued to have nasal congestion that is thick and green/yellow. Sinus pressure is her worst symptom.  Pt notes she usually responds very well to amoxicillin.  No recent travels or sick contacts.  No leg trauma, sedentary periods, h/o cancer, or tobacco use.   Past Medical History  Diagnosis Date   Depression    Asthma    Allergy      Home Meds: Prior to Admission medications   Medication Sig Start Date End Date Taking? Authorizing Provider  albuterol (PROVENTIL HFA;VENTOLIN HFA) 108 (90 BASE) MCG/ACT inhaler Inhale 2 puffs into the lungs every 4 (four) hours as needed for wheezing. PATIENT NEEDS OFFICE VISIT FOR ADD'L REFILLS 05/20/13  Yes Shawnee Knapp, MD  amoxicillin (AMOXIL) 875 MG tablet Take 2 tablets (1,750 mg total) by mouth 2 (two) times daily. 11/06/13  Yes Chelle S Jeffery, PA-C  buPROPion (WELLBUTRIN XL) 300 MG 24 hr tablet Take 300 mg by mouth daily.   Yes Norma Fredrickson, MD  fluticasone (FLONASE) 50 MCG/ACT nasal spray Place 2 sprays into the nose daily. 05/20/13  Yes Shawnee Knapp, MD  ipratropium (ATROVENT) 0.03 % nasal spray Place 2 sprays into both nostrils 2 (two) times daily. 11/06/13  Yes Chelle S Jeffery, PA-C   norethindrone-ethinyl estradiol-iron (MICROGESTIN FE,GILDESS FE,LOESTRIN FE) 1.5-30 MG-MCG tablet Take 1 tablet by mouth daily. 05/20/13  Yes Shawnee Knapp, MD  sertraline (ZOLOFT) 100 MG tablet 1 po bid   Yes Norma Fredrickson, MD  chlorpheniramine-HYDROcodone (TUSSIONEX PENNKINETIC ER) 10-8 MG/5ML LQCR Take 5 mLs by mouth every 12 (twelve) hours as needed for cough (cough). 11/06/13 11/15/13  Chelle Janalee Dane, PA-C    Allergies: No Known Allergies  History   Social History   Marital Status: Married    Spouse Name: N/A    Number of Children: N/A   Years of Education: N/A   Occupational History   Not on file.   Social History Main Topics   Smoking status: Never Smoker    Smokeless tobacco: Not on file   Alcohol Use: No   Drug Use: No   Sexual Activity: Yes    Birth Control/ Protection: Condom   Other Topics Concern   Not on file   Social History Narrative   No narrative on file     Review of Systems: Constitutional: negative for chills, fever, night sweats or weight changes Cardiovascular: negative for chest pain or palpitations Respiratory: negative for hemoptysis, wheezing, or shortness of breath Abdominal: negative for abdominal pain, nausea, vomiting or diarrhea Dermatological: negative for rash Neurologic: negative for headache   Physical Exam: Blood pressure 140/92, pulse 70, temperature 98.2 F (36.8 C), temperature source Oral, resp. rate 18, weight 211 lb (95.709  kg), SpO2 97.00%., Body mass index is 37.95 kg/(m^2). General: Well developed, well nourished, in no acute distress. Head: Normocephalic, atraumatic, eyes without discharge, sclera non-icteric, nares are congested.  Thick white nasal discharge in both nasal passages. Bilateral auditory canals clear, TM's are without perforation, pearly grey with reflective cone of light bilaterally. Serous effusion bilaterally behind TM's. Maxillary sinus TTP. Oral cavity moist, dentition normal. Posterior pharynx  with post nasal drip and mild erythema. No peritonsillar abscess or tonsillar exudate. Neck: Supple. No thyromegaly. Full ROM. No lymphadenopathy. Lungs: Clear bilaterally to auscultation without wheezes, rales, or rhonchi. Breathing is unlabored.  Heart: RRR with S1 S2. No murmurs, rubs, or gallops appreciated. Msk:  Strength and tone normal for age. Extremities: No clubbing or cyanosis. No edema. Neuro: Alert and oriented X 3. Moves all extremities spontaneously. CNII-XII grossly in tact. Psych:  Responds to questions appropriately with a normal affect.    ASSESSMENT AND PLAN:  43 y.o. year old female with sinusitis -Levaquin 560m 1x/day -Rest/fluids -RTC precautions -RTC 3-5 days if no improvement No diagnosis found.  Signed, KRobyn Haber MD 11/14/2013 10:10 AM

## 2013-11-14 NOTE — Telephone Encounter (Signed)
Needs to RTC if not improved with antibiotics

## 2013-11-14 NOTE — Patient Instructions (Signed)

## 2013-11-14 NOTE — Telephone Encounter (Signed)
Patient notified and voiced understanding.  She will come in today

## 2013-11-29 ENCOUNTER — Other Ambulatory Visit: Payer: Self-pay | Admitting: Family Medicine

## 2014-07-07 ENCOUNTER — Other Ambulatory Visit: Payer: Self-pay | Admitting: Family Medicine

## 2014-08-03 ENCOUNTER — Other Ambulatory Visit (INDEPENDENT_AMBULATORY_CARE_PROVIDER_SITE_OTHER): Payer: Self-pay | Admitting: General Surgery

## 2014-08-04 ENCOUNTER — Other Ambulatory Visit: Payer: Self-pay

## 2014-08-05 ENCOUNTER — Ambulatory Visit (INDEPENDENT_AMBULATORY_CARE_PROVIDER_SITE_OTHER): Payer: BC Managed Care – PPO | Admitting: Family Medicine

## 2014-08-05 ENCOUNTER — Encounter: Payer: Self-pay | Admitting: Family Medicine

## 2014-08-05 VITALS — BP 122/60 | HR 76 | Temp 98.5°F | Resp 16 | Ht 62.5 in | Wt 214.0 lb

## 2014-08-05 DIAGNOSIS — E559 Vitamin D deficiency, unspecified: Secondary | ICD-10-CM

## 2014-08-05 DIAGNOSIS — R7401 Elevation of levels of liver transaminase levels: Secondary | ICD-10-CM

## 2014-08-05 DIAGNOSIS — R740 Nonspecific elevation of levels of transaminase and lactic acid dehydrogenase [LDH]: Secondary | ICD-10-CM

## 2014-08-05 DIAGNOSIS — Z23 Encounter for immunization: Secondary | ICD-10-CM

## 2014-08-05 DIAGNOSIS — Z113 Encounter for screening for infections with a predominantly sexual mode of transmission: Secondary | ICD-10-CM

## 2014-08-05 DIAGNOSIS — Z139 Encounter for screening, unspecified: Secondary | ICD-10-CM

## 2014-08-05 DIAGNOSIS — Z1159 Encounter for screening for other viral diseases: Secondary | ICD-10-CM

## 2014-08-05 DIAGNOSIS — Z130 Encounter for screening for diseases of the blood and blood-forming organs and certain disorders involving the immune mechanism: Secondary | ICD-10-CM

## 2014-08-05 DIAGNOSIS — Z1321 Encounter for screening for nutritional disorder: Secondary | ICD-10-CM

## 2014-08-05 DIAGNOSIS — Z3041 Encounter for surveillance of contraceptive pills: Secondary | ICD-10-CM

## 2014-08-05 DIAGNOSIS — E785 Hyperlipidemia, unspecified: Secondary | ICD-10-CM

## 2014-08-05 DIAGNOSIS — Z1389 Encounter for screening for other disorder: Secondary | ICD-10-CM

## 2014-08-05 DIAGNOSIS — F39 Unspecified mood [affective] disorder: Secondary | ICD-10-CM

## 2014-08-05 DIAGNOSIS — Z114 Encounter for screening for human immunodeficiency virus [HIV]: Secondary | ICD-10-CM

## 2014-08-05 DIAGNOSIS — E669 Obesity, unspecified: Secondary | ICD-10-CM

## 2014-08-05 LAB — COMPLETE METABOLIC PANEL WITH GFR
ALBUMIN: 4 g/dL (ref 3.5–5.2)
ALK PHOS: 41 U/L (ref 39–117)
ALT: 96 U/L — ABNORMAL HIGH (ref 0–35)
AST: 56 U/L — AB (ref 0–37)
BILIRUBIN TOTAL: 0.4 mg/dL (ref 0.2–1.2)
BUN: 12 mg/dL (ref 6–23)
CO2: 24 mEq/L (ref 19–32)
Calcium: 9.2 mg/dL (ref 8.4–10.5)
Chloride: 104 mEq/L (ref 96–112)
Creat: 0.8 mg/dL (ref 0.50–1.10)
GFR, Est African American: >89 mL/min
GLUCOSE: 85 mg/dL (ref 70–99)
POTASSIUM: 4 meq/L (ref 3.5–5.3)
Sodium: 139 mEq/L (ref 135–145)
Total Protein: 6.7 g/dL (ref 6.0–8.3)

## 2014-08-05 LAB — LIPID PANEL
CHOL/HDL RATIO: 4 ratio
Cholesterol: 274 mg/dL — ABNORMAL HIGH (ref 0–200)
HDL: 68 mg/dL (ref >39)
LDL Cholesterol: 187 mg/dL — ABNORMAL HIGH (ref 0–99)
Triglycerides: 95 mg/dL (ref <150)
VLDL: 19 mg/dL (ref 0–40)

## 2014-08-05 LAB — CBC WITH DIFFERENTIAL/PLATELET
Basophils Absolute: 0 10*3/uL (ref 0.0–0.1)
Basophils Relative: 0 % (ref 0–1)
Eosinophils Absolute: 0.3 10*3/uL (ref 0.0–0.7)
Eosinophils Relative: 5 % (ref 0–5)
HEMATOCRIT: 37.5 % (ref 36.0–46.0)
HEMOGLOBIN: 13.3 g/dL (ref 12.0–15.0)
LYMPHS ABS: 1.4 10*3/uL (ref 0.7–4.0)
Lymphocytes Relative: 27 % (ref 12–46)
MCH: 30.5 pg (ref 26.0–34.0)
MCHC: 35.5 g/dL (ref 30.0–36.0)
MCV: 86 fL (ref 78.0–100.0)
MONO ABS: 0.4 10*3/uL (ref 0.1–1.0)
MONOS PCT: 7 % (ref 3–12)
MPV: 10.1 fL (ref 9.4–12.4)
NEUTROS ABS: 3.1 10*3/uL (ref 1.7–7.7)
NEUTROS PCT: 61 % (ref 43–77)
Platelets: 249 10*3/uL (ref 150–400)
RBC: 4.36 MIL/uL (ref 3.87–5.11)
RDW: 12.5 % (ref 11.5–15.5)
WBC: 5 10*3/uL (ref 4.0–10.5)

## 2014-08-05 LAB — TSH: TSH: 2.006 u[IU]/mL (ref 0.350–4.500)

## 2014-08-05 MED ORDER — NORETHIN ACE-ETH ESTRAD-FE 1.5-30 MG-MCG PO TABS: ORAL_TABLET | ORAL | Status: DC

## 2014-08-05 NOTE — Progress Notes (Addendum)
Subjective:    Patient ID: Selena Chapman, female    DOB: June 13, 1971, 43 y.o.   MRN: [id number] This chart was scribed for Delman Cheadle, MD by Zola Button, Medical Scribe. This patient was seen in Room 24 and the patient's care was started at 11:43 AM.  Chief Complaint  Patient presents with  . Contraception    birth control pill     HPI HPI Comments: Selena Chapman is a 43 y.o. female who presents to the [facility] for a birth control renewal.  Patient had a pap smear in September 2014 - normal with no high risk HPV. She has no hx of abnormal pap smears. She stopped smoking in 2013. Patient was considering IUD last visit.  Patient is considering getting lap band surgery; she just had her first consultation. Her mother had the surgery 3 years ago and has done well on it; her mother has been seeing slow weight loss. She has not eaten yet today. She has been using apps and has tried various diets to try to lose weight. Patient has lost over 120 pounds in the past, but she has gained all of it back. She now has a child and her schedule is busier, so it is harder for her to exercise now and she is seeing that her weight is continuing to take more tolls on her health.  Patient sees a psychiatrist: Dr. Lita Mains at [facility] practice. She has been switched off of Wellbutrin.  When she has periods, she says they have been fine. She denies vaginal discharge.  She is sexually active in a monogamous relationship (not with her daughter's father - they are divorced.) Does usually use condoms.  She has not had a flu shot yet, but wants to have one done today.  Past Medical History  Diagnosis Date  . Depression   . Asthma   . Allergy    Current Outpatient Prescriptions on File Prior to Visit  Medication Sig Dispense Refill  . albuterol (PROVENTIL HFA;VENTOLIN HFA) 108 (90 BASE) MCG/ACT inhaler Inhale 2 puffs into the lungs every 4 (four) hours as needed for  wheezing. PATIENT NEEDS OFFICE VISIT FOR ADD'L REFILLS 2 Inhaler 12  . fluticasone (FLONASE) 50 MCG/ACT nasal spray Place 2 sprays into the nose daily. (Patient not taking: Reported on 08/05/2014) 16 g 11  . ipratropium (ATROVENT) 0.03 % nasal spray Place 2 sprays into both nostrils 2 (two) times daily. (Patient not taking: Reported on 08/05/2014) 30 mL 0   No current facility-administered medications on file prior to visit.   No Known Allergies  Review of Systems  Genitourinary: Negative for vaginal discharge and menstrual problem.       Objective:  BP 122/60 mmHg  Pulse 76  Temp(Src) 98.5 F (36.9 C)  Resp 16  Ht 5' 2.5" (1.588 m)  Wt 214 lb (97.07 kg)  BMI 38.49 kg/m2  SpO2 98%  Physical Exam  Constitutional: She is oriented to person, place, and time. She appears well-developed and well-nourished. No distress.  HENT:  Head: Normocephalic and atraumatic.  Mouth/Throat: Oropharynx is clear and moist. No oropharyngeal exudate.  Eyes: Pupils are equal, round, and reactive to light.  Neck: Neck supple.  Cardiovascular: Normal rate.   Pulmonary/Chest: Effort normal and breath sounds normal. No respiratory distress. She has no wheezes. She has no rales.  Musculoskeletal: She exhibits no edema.  Neurological: She is alert and oriented to person, place, and time. No cranial nerve deficit.  Skin: Skin is warm and dry. No rash noted.  Psychiatric: She has a normal mood and affect. Her behavior is normal.  Nursing note and vitals reviewed.         Assessment & Plan:   Family planning, BCP (birth control pills) maintenance - Plan: hCG, serum, qualitative  Obesity - Plan: TSH, Hemoglobin A1c, Vitamin D, 25-hydroxy - forms from [place] completed and faxed.  Letter supporting lab bad surgery due to obesity which has been resistent to several prev diets and has comorbidities of hyperlipidemia and fatty liver written and forwarded to Dr. Redmond Pulling at [facility] through  [facility].  Screening for nephropathy - Plan: COMPLETE METABOLIC PANEL WITH GFR  Need for hepatitis C screening test - Plan: Hepatitis C Ab Reflex HCV RNA, QUANT  Screening - Plan: H. pylori antibody, IgG  Hyperlipidemia - Plan: Lipid panel  Screening for iron deficiency anemia - Plan: CBC with Differential  Screening for HIV (human immunodeficiency virus) - Plan: HIV antibody  Routine screening for STI (sexually transmitted infection) - Plan: RPR  Encounter for vitamin deficiency screening - Plan: Vitamin D, 25-hydroxy  Vitamin D deficiency - high dose vit D started  6 mos, then start 1000-2000u otc vit D daily  Elevated transaminase level - suspect due to fatty liver due to obesity and high chol with neg hep C test but will obtain RUQ [place] to confirm prior to surgery  Mood d/o NOS - off zoloft and wellbutrin now on lamictal and klonopin followed by Dr. Lita Mains - a new psychiastrist in [facility]. Tomas[facility] practice  Meds ordered this encounter  Medications  . lamoTRIgine (LAMICTAL) 25 MG tablet    Sig: Take 25 mg by mouth daily.  . clonazePAM (KLONOPIN) 0.5 MG tablet    Sig: Take 0.5 mg by mouth 2 (two) times daily as needed for anxiety.  . norethindrone-ethinyl estradiol-iron (MICROGESTIN FE 1.5/30) 1.5-30 MG-MCG tablet    Sig: One daily    Dispense:  90 tablet    Refill:  4  . ergocalciferol (DRISDOL) 50000 UNITS capsule    Sig: Take 1 capsule (50,000 Units total) by mouth once a week.    Dispense:  12 capsule    Refill:  1    I personally performed the services described in this documentation, which was scribed in my presence. The recorded information has been reviewed and considered, and addended by me as needed.  Delman Cheadle, MD MPH   Results for orders placed or performed in visit on 08/05/14  H. pylori antibody, IgG  Result Value Ref Range   H Pylori IgG 0.55 ISR  hCG, serum, qualitative  Result Value Ref Range   Preg, Serum NEG   Lipid panel  Result Value Ref Range    Cholesterol 274 (H) 0 - 200 mg/dL   Triglycerides 95 <150 mg/dL   HDL 68 >39 mg/dL   Total CHOL/HDL Ratio 4.0 Ratio   VLDL 19 0 - 40 mg/dL   LDL Cholesterol 187 (H) 0 - 99 mg/dL  COMPLETE METABOLIC PANEL WITH GFR  Result Value Ref Range   Sodium 139 135 - 145 mEq/L   Potassium 4.0 3.5 - 5.3 mEq/L   Chloride 104 96 - 112 mEq/L   CO2 24 19 - 32 mEq/L   Glucose, Bld 85 70 - 99 mg/dL   BUN 12 6 - 23 mg/dL   Creat 0.80 0.50 - 1.10 mg/dL   Total Bilirubin 0.4 0.2 - 1.2 mg/dL   Alkaline Phosphatase  41 39 - 117 U/L   AST 56 (H) 0 - 37 U/L   ALT 96 (H) 0 - 35 U/L   Total Protein 6.7 6.0 - 8.3 g/dL   Albumin 4.0 3.5 - 5.2 g/dL   Calcium 9.2 8.4 - 10.5 mg/dL   GFR, Est African American >89 mL/min   GFR, Est Non African American >89 mL/min  CBC with Differential  Result Value Ref Range   WBC 5.0 4.0 - 10.5 K/uL   RBC 4.36 3.87 - 5.11 MIL/uL   Hemoglobin 13.3 12.0 - 15.0 g/dL   HCT 37.5 36.0 - 46.0 %   MCV 86.0 78.0 - 100.0 fL   MCH 30.5 26.0 - 34.0 pg   MCHC 35.5 30.0 - 36.0 g/dL   RDW 12.5 11.5 - 15.5 %   Platelets 249 150 - 400 K/uL   MPV 10.1 9.4 - 12.4 fL   Neutrophils Relative % 61 43 - 77 %   Neutro Abs 3.1 1.7 - 7.7 K/uL   Lymphocytes Relative 27 12 - 46 %   Lymphs Abs 1.4 0.7 - 4.0 K/uL   Monocytes Relative 7 3 - 12 %   Monocytes Absolute 0.4 0.1 - 1.0 K/uL   Eosinophils Relative 5 0 - 5 %   Eosinophils Absolute 0.3 0.0 - 0.7 K/uL   Basophils Relative 0 0 - 1 %   Basophils Absolute 0.0 0.0 - 0.1 K/uL   Smear Review Criteria for review not met   TSH  Result Value Ref Range   TSH 2.006 0.350 - 4.500 uIU/mL  Hemoglobin A1c  Result Value Ref Range   Hgb A1c MFr Bld 5.0 <5.7 %   Mean Plasma Glucose 97 <117 mg/dL  HIV antibody  Result Value Ref Range   HIV 1&2 Ab, 4th Generation NONREACTIVE NONREACTIVE  RPR  Result Value Ref Range   RPR NON REAC NON REAC  Vitamin D, 25-hydroxy  Result Value Ref Range   Vit D, 25-Hydroxy 17 (L) 30 - 100 ng/mL  Hepatitis C  antibody  Result Value Ref Range   HCV Ab NEGATIVE NEGATIVE

## 2014-08-05 NOTE — Patient Instructions (Signed)
Health Maintenance Adopting a healthy lifestyle and getting preventive care can go a long way to promote health and wellness. Talk with your health care provider about what schedule of regular examinations is right for you. This is a good chance for you to check in with your provider about disease prevention and staying healthy. In between checkups, there are plenty of things you can do on your own. Experts have done a lot of research about which lifestyle changes and preventive measures are most likely to keep you healthy. Ask your health care provider for more information. WEIGHT AND DIET  Eat a healthy diet  Be sure to include plenty of vegetables, fruits, low-fat dairy products, and lean protein.  Do not eat a lot of foods high in solid fats, added sugars, or salt.  Get regular exercise. This is one of the most important things you can do for your health.  Most adults should exercise for at least 150 minutes each week. The exercise should increase your heart rate and make you sweat (moderate-intensity exercise).  Most adults should also do strengthening exercises at least twice a week. This is in addition to the moderate-intensity exercise.  Maintain a healthy weight  Body mass index (BMI) is a measurement that can be used to identify possible weight problems. It estimates body fat based on height and weight. Your health care provider can help determine your BMI and help you achieve or maintain a healthy weight.  For females 25 years of age and older:   A BMI below 18.5 is considered underweight.  A BMI of 18.5 to 24.9 is normal.  A BMI of 25 to 29.9 is considered overweight.  A BMI of 30 and above is considered obese.  Watch levels of cholesterol and blood lipids  You should start having your blood tested for lipids and cholesterol at 41 years of age, then have this test every 5 years.  You may need to have your cholesterol levels checked more often if:  Your lipid or  cholesterol levels are high.  You are older than 41 years of age.  You are at high risk for heart disease.  CANCER SCREENING   Lung Cancer  Lung cancer screening is recommended for adults 97-92 years old who are at high risk for lung cancer because of a history of smoking.  A yearly low-dose CT scan of the lungs is recommended for people who:  Currently smoke.  Have quit within the past 15 years.  Have at least a 30-pack-year history of smoking. A pack year is smoking an average of one pack of cigarettes a day for 1 year.  Yearly screening should continue until it has been 15 years since you quit.  Yearly screening should stop if you develop a health problem that would prevent you from having lung cancer treatment.  Breast Cancer  Practice breast self-awareness. This means understanding how your breasts normally appear and feel.  It also means doing regular breast self-exams. Let your health care provider know about any changes, no matter how small.  If you are in your 20s or 30s, you should have a clinical breast exam (CBE) by a health care provider every 1-3 years as part of a regular health exam.  If you are 76 or older, have a CBE every year. Also consider having a breast X-ray (mammogram) every year.  If you have a family history of breast cancer, talk to your health care provider about genetic screening.  If you are  at high risk for breast cancer, talk to your health care provider about having an MRI and a mammogram every year.  Breast cancer gene (BRCA) assessment is recommended for women who have family members with BRCA-related cancers. BRCA-related cancers include:  Breast.  Ovarian.  Tubal.  Peritoneal cancers.  Results of the assessment will determine the need for genetic counseling and BRCA1 and BRCA2 testing. Cervical Cancer Routine pelvic examinations to screen for cervical cancer are no longer recommended for nonpregnant women who are considered low  risk for cancer of the pelvic organs (ovaries, uterus, and vagina) and who do not have symptoms. A pelvic examination may be necessary if you have symptoms including those associated with pelvic infections. Ask your health care provider if a screening pelvic exam is right for you.   The Pap test is the screening test for cervical cancer for women who are considered at risk.  If you had a hysterectomy for a problem that was not cancer or a condition that could lead to cancer, then you no longer need Pap tests.  If you are older than 65 years, and you have had normal Pap tests for the past 10 years, you no longer need to have Pap tests.  If you have had past treatment for cervical cancer or a condition that could lead to cancer, you need Pap tests and screening for cancer for at least 20 years after your treatment.  If you no longer get a Pap test, assess your risk factors if they change (such as having a new sexual partner). This can affect whether you should start being screened again.  Some women have medical problems that increase their chance of getting cervical cancer. If this is the case for you, your health care provider may recommend more frequent screening and Pap tests.  The human papillomavirus (HPV) test is another test that may be used for cervical cancer screening. The HPV test looks for the virus that can cause cell changes in the cervix. The cells collected during the Pap test can be tested for HPV.  The HPV test can be used to screen women 30 years of age and older. Getting tested for HPV can extend the interval between normal Pap tests from three to five years.  An HPV test also should be used to screen women of any age who have unclear Pap test results.  After 41 years of age, women should have HPV testing as often as Pap tests.  Colorectal Cancer  This type of cancer can be detected and often prevented.  Routine colorectal cancer screening usually begins at 41 years of  age and continues through 41 years of age.  Your health care provider may recommend screening at an earlier age if you have risk factors for colon cancer.  Your health care provider may also recommend using home test kits to check for hidden blood in the stool.  A small camera at the end of a tube can be used to examine your colon directly (sigmoidoscopy or colonoscopy). This is done to check for the earliest forms of colorectal cancer.  Routine screening usually begins at age 50.  Direct examination of the colon should be repeated every 5-10 years through 41 years of age. However, you may need to be screened more often if early forms of precancerous polyps or small growths are found. Skin Cancer  Check your skin from head to toe regularly.  Tell your health care provider about any new moles or changes in   moles, especially if there is a change in a mole's shape or color.  Also tell your health care provider if you have a mole that is larger than the size of a pencil eraser.  Always use sunscreen. Apply sunscreen liberally and repeatedly throughout the day.  Protect yourself by wearing long sleeves, pants, a wide-brimmed hat, and sunglasses whenever you are outside. HEART DISEASE, DIABETES, AND HIGH BLOOD PRESSURE   Have your blood pressure checked at least every 1-2 years. High blood pressure causes heart disease and increases the risk of stroke.  If you are between 75 years and 42 years old, ask your health care provider if you should take aspirin to prevent strokes.  Have regular diabetes screenings. This involves taking a blood sample to check your fasting blood sugar level.  If you are at a normal weight and have a low risk for diabetes, have this test once every three years after 41 years of age.  If you are overweight and have a high risk for diabetes, consider being tested at a younger age or more often. PREVENTING INFECTION  Hepatitis B  If you have a higher risk for  hepatitis B, you should be screened for this virus. You are considered at high risk for hepatitis B if:  You were born in a country where hepatitis B is common. Ask your health care provider which countries are considered high risk.  Your parents were born in a high-risk country, and you have not been immunized against hepatitis B (hepatitis B vaccine).  You have HIV or AIDS.  You use needles to inject street drugs.  You live with someone who has hepatitis B.  You have had sex with someone who has hepatitis B.  You get hemodialysis treatment.  You take certain medicines for conditions, including cancer, organ transplantation, and autoimmune conditions. Hepatitis C  Blood testing is recommended for:  Everyone born from [birth year] through 1963.  Anyone with known risk factors for hepatitis C. Sexually transmitted infections (STIs)  You should be screened for sexually transmitted infections (STIs) including gonorrhea and chlamydia if:  You are sexually active and are younger than 41 years of age.  You are older than 41 years of age and your health care provider tells you that you are at risk for this type of infection.  Your sexual activity has changed since you were last screened and you are at an increased risk for chlamydia or gonorrhea. Ask your health care provider if you are at risk.  If you do not have HIV, but are at risk, it may be recommended that you take a prescription medicine daily to prevent HIV infection. This is called pre-exposure prophylaxis (PrEP). You are considered at risk if:  You are sexually active and do not regularly use condoms or know the HIV status of your partner(s).  You take drugs by injection.  You are sexually active with a partner who has HIV. Talk with your health care provider about whether you are at high risk of being infected with HIV. If you choose to begin PrEP, you should first be tested for HIV. You should then be tested every 3 months for  as long as you are taking PrEP.  PREGNANCY   If you are premenopausal and you may become pregnant, ask your health care provider about preconception counseling.  If you may become pregnant, take 400 to 800 micrograms (mcg) of folic acid every day.  If you want to prevent pregnancy, talk to your  health care provider about birth control (contraception). OSTEOPOROSIS AND MENOPAUSE   Osteoporosis is a disease in which the bones lose minerals and strength with aging. This can result in serious bone fractures. Your risk for osteoporosis can be identified using a bone density scan.  If you are 54 years of age or older, or if you are at risk for osteoporosis and fractures, ask your health care provider if you should be screened.  Ask your health care provider whether you should take a calcium or vitamin D supplement to lower your risk for osteoporosis.  Menopause may have certain physical symptoms and risks.  Hormone replacement therapy may reduce some of these symptoms and risks. Talk to your health care provider about whether hormone replacement therapy is right for you.  HOME CARE INSTRUCTIONS   Schedule regular health, dental, and eye exams.  Stay current with your immunizations.   Do not use any tobacco products including cigarettes, chewing tobacco, or electronic cigarettes.  If you are pregnant, do not drink alcohol.  If you are breastfeeding, limit how much and how often you drink alcohol.  Limit alcohol intake to no more than 1 drink per day for nonpregnant women. One drink equals 12 ounces of beer, 5 ounces of wine, or 1 ounces of hard liquor.  Do not use street drugs.  Do not share needles.  Ask your health care provider for help if you need support or information about quitting drugs.  Tell your health care provider if you often feel depressed.  Tell your health care provider if you have ever been abused or do not feel safe at home. Document Released: 02/25/2011  Document Revised: 12/27/2013 Document Reviewed: 07/14/2013 [facility] Patient Information 2015 [place], [place]. This information is not intended to replace advice given to you by your health care provider. Make sure you discuss any questions you have with your health care provider. Obesity Obesity is defined as having too much total body fat and a body mass index (BMI) of 30 or more. BMI is an estimate of body fat and is calculated from your height and weight. Obesity happens when you consume more calories than you can burn by exercising or performing daily physical tasks. Prolonged obesity can cause major illnesses or emergencies, such as:   Stroke.  Heart disease.  Diabetes.  Cancer.  Arthritis.  High blood pressure (hypertension).  High cholesterol.  Sleep apnea.  Erectile dysfunction.  Infertility problems. CAUSES   Regularly eating unhealthy foods.  Physical inactivity.  Certain disorders, such as an underactive thyroid (hypothyroidism), Cushing's syndrome, and polycystic ovarian syndrome.  Certain medicines, such as steroids, some depression medicines, and antipsychotics.  Genetics.  Lack of sleep. DIAGNOSIS  A health care provider can diagnose obesity after calculating your BMI. Obesity will be diagnosed if your BMI is 30 or higher.  There are other methods of measuring obesity levels. Some other methods include measuring your skinfold thickness, your waist circumference, and comparing your hip circumference to your waist circumference. TREATMENT  A healthy treatment program includes some or all of the following:  Long-term dietary changes.  Exercise and physical activity.  Behavioral and lifestyle changes.  Medicine only under the supervision of your health care provider. Medicines may help, but only if they are used with diet and exercise programs. An unhealthy treatment program includes:  Fasting.  Fad diets.  Supplements and drugs. These choices do  not succeed in long-term weight control.  HOME CARE INSTRUCTIONS   Exercise and perform physical activity  as directed by your health care provider. To increase physical activity, try the following:  Use stairs instead of elevators.  Park farther away from store entrances.  Garden, bike, or walk instead of watching television or using the computer.  Eat healthy, low-calorie foods and drinks on a regular basis. Eat more fruits and vegetables. Use low-calorie cookbooks or take healthy cooking classes.  Limit fast food, sweets, and processed snack foods.  Eat smaller portions.  Keep a daily journal of everything you eat. There are many free websites to help you with this. It may be helpful to measure your foods so you can determine if you are eating the correct portion sizes.  Avoid drinking alcohol. Drink more water and drinks without calories.  Take vitamins and supplements only as recommended by your health care provider.  Weight-loss support groups, Tax adviser, counselors, and stress reduction education can also be very helpful. SEEK IMMEDIATE MEDICAL CARE IF:  You have chest pain or tightness.  You have trouble breathing or feel short of breath.  You have weakness or leg numbness.  You feel confused or have trouble talking.  You have sudden changes in your vision. MAKE SURE YOU:  Understand these instructions.  Will watch your condition.  Will get help right away if you are not doing well or get worse. Document Released: 09/19/2004 Document Revised: 12/27/2013 Document Reviewed: 09/18/2011 [facility] Patient Information 2015 [place], [place]. This information is not intended to replace advice given to you by your health care provider. Make sure you discuss any questions you have with your health care provider.

## 2014-08-06 LAB — HEMOGLOBIN A1C
HEMOGLOBIN A1C: 5 % (ref <5.7)
Mean Plasma Glucose: 97 mg/dL (ref <117)

## 2014-08-06 LAB — HCG, SERUM, QUALITATIVE: Preg, Serum: NEGATIVE

## 2014-08-06 LAB — HIV ANTIBODY (ROUTINE TESTING W REFLEX): HIV: NONREACTIVE

## 2014-08-06 LAB — RPR

## 2014-08-06 LAB — HEPATITIS C ANTIBODY: HCV Ab: NEGATIVE

## 2014-08-06 LAB — VITAMIN D 25 HYDROXY (VIT D DEFICIENCY, FRACTURES): VIT D 25 HYDROXY: 17 ng/mL — AB (ref 30–100)

## 2014-08-08 LAB — H. PYLORI ANTIBODY, IGG: H Pylori IgG: 0.55 {ISR}

## 2014-08-11 DIAGNOSIS — E669 Obesity, unspecified: Secondary | ICD-10-CM | POA: Insufficient documentation

## 2014-08-11 DIAGNOSIS — Z8639 Personal history of other endocrine, nutritional and metabolic disease: Secondary | ICD-10-CM | POA: Insufficient documentation

## 2014-08-11 DIAGNOSIS — E785 Hyperlipidemia, unspecified: Secondary | ICD-10-CM | POA: Insufficient documentation

## 2014-08-11 MED ORDER — ERGOCALCIFEROL 1.25 MG (50000 UT) PO CAPS: 50000.0000 [IU] | ORAL_CAPSULE | ORAL | Status: DC

## 2014-08-23 ENCOUNTER — Other Ambulatory Visit: Payer: Self-pay | Admitting: Family Medicine

## 2014-08-23 ENCOUNTER — Ambulatory Visit (HOSPITAL_COMMUNITY)
Admission: RE | Admit: 2014-08-23 | Discharge: 2014-08-23 | Disposition: A | Discharge status: Home / Self Care | Payer: BC Managed Care – PPO | Source: Ambulatory Visit | Attending: General Surgery | Admitting: General Surgery

## 2014-08-23 ENCOUNTER — Other Ambulatory Visit: Payer: Self-pay

## 2014-08-23 DIAGNOSIS — J45909 Unspecified asthma, uncomplicated: Secondary | ICD-10-CM | POA: Diagnosis not present

## 2014-08-23 DIAGNOSIS — Z01818 Encounter for other preprocedural examination: Secondary | ICD-10-CM | POA: Insufficient documentation

## 2014-08-23 DIAGNOSIS — R05 Cough: Secondary | ICD-10-CM | POA: Insufficient documentation

## 2014-08-24 NOTE — Telephone Encounter (Signed)
Dr Brigitte Pulse, I don't see asthma discussed at [place]. OK to give RFs?

## 2014-09-06 ENCOUNTER — Ambulatory Visit
Admission: RE | Admit: 2014-09-06 | Discharge: 2014-09-06 | Disposition: A | Discharge status: Home / Self Care | Payer: BLUE CROSS/BLUE SHIELD | Source: Ambulatory Visit | Attending: Family Medicine | Admitting: Family Medicine

## 2014-09-06 ENCOUNTER — Telehealth: Payer: Self-pay | Admitting: *Deleted

## 2014-09-06 DIAGNOSIS — E785 Hyperlipidemia, unspecified: Secondary | ICD-10-CM

## 2014-09-06 DIAGNOSIS — E669 Obesity, unspecified: Secondary | ICD-10-CM

## 2014-09-06 DIAGNOSIS — R7401 Elevation of levels of liver transaminase levels: Secondary | ICD-10-CM

## 2014-09-06 DIAGNOSIS — R740 Nonspecific elevation of levels of transaminase and lactic acid dehydrogenase [LDH]: Secondary | ICD-10-CM

## 2014-09-06 NOTE — Telephone Encounter (Signed)
Pt would like to know results of ultrasound results

## 2014-09-07 ENCOUNTER — Ambulatory Visit (INDEPENDENT_AMBULATORY_CARE_PROVIDER_SITE_OTHER): Payer: BLUE CROSS/BLUE SHIELD | Admitting: Emergency Medicine

## 2014-09-07 VITALS — BP 110/70 | HR 57 | Temp 98.9°F | Resp 16 | Ht 64.0 in | Wt 209.0 lb

## 2014-09-07 DIAGNOSIS — R300 Dysuria: Secondary | ICD-10-CM

## 2014-09-07 DIAGNOSIS — R350 Frequency of micturition: Secondary | ICD-10-CM

## 2014-09-07 LAB — POCT UA - MICROSCOPIC ONLY
BACTERIA, U MICROSCOPIC: NEGATIVE
CASTS, UR, LPF, POC: NEGATIVE
Crystals, Ur, HPF, POC: NEGATIVE
MUCUS UA: NEGATIVE
Yeast, UA: NEGATIVE

## 2014-09-07 LAB — POCT URINALYSIS DIPSTICK
Bilirubin, UA: NEGATIVE
Blood, UA: NEGATIVE
Glucose, UA: NEGATIVE
Ketones, UA: NEGATIVE
LEUKOCYTES UA: NEGATIVE
Nitrite, UA: NEGATIVE
PROTEIN UA: NEGATIVE
Spec Grav, UA: <=1.005
UROBILINOGEN UA: 0.2
pH, UA: 5.5

## 2014-09-07 NOTE — Progress Notes (Signed)
Subjective:  This chart was scribed for Selena Queen, MD by Mercy Moore, Medial Scribe. This patient was seen in room 5 and the patient's care was started at 10:01 AM.    Patient ID: Selena Chapman, female    DOB: 06-21-1971, 44 y.o.   MRN: [id number] Chief Complaint  Patient presents with  . Dysuria    x2 days  . Urinary Frequency  . urination burning    HPI HPI Comments: Selena Chapman is a 44 y.o. female who presents to the [facility] complaining of UTI symptoms ongoing for two days now. Patient reports urgency and burning when she does urinates states that she "is uncertain when she has to go." Patient states that she had been drinking lots of water since presentation of her symptoms. Patient denies fever, chills or back aches, but reports lower abdominal pressure. Patient does not have STD concerns reporting recent screening that came back clean.   Patient Active Problem List   Diagnosis Date Noted  . Vitamin D deficiency 08/11/2014  . Hyperlipidemia 08/11/2014  . Obesity 08/11/2014  . Asthma 09/23/2011  . Environmental allergies 09/23/2011  . Depression    Past Medical History  Diagnosis Date  . Depression   . Asthma   . Allergy    History reviewed. No pertinent past surgical history. No Known Allergies Prior to Admission medications   Medication Sig Start Date End Date Taking? Authorizing Provider  clonazePAM (KLONOPIN) 0.5 MG tablet Take 0.5 mg by mouth 2 (two) times daily as needed for anxiety.   Yes Historical Provider, MD  ergocalciferol (DRISDOL) 50000 UNITS capsule Take 1 capsule (50,000 Units total) by mouth once a week. 08/11/14  Yes Shawnee Knapp, MD  fluticasone (FLONASE) 50 MCG/ACT nasal spray Place 2 sprays into the nose daily. 05/20/13  Yes Shawnee Knapp, MD  lamoTRIgine (LAMICTAL) 25 MG tablet Take 25 mg by mouth daily.   Yes Historical Provider, MD  norethindrone-ethinyl estradiol-iron (MICROGESTIN FE 1.5/30) 1.5-30 MG-MCG  tablet One daily 08/05/14  Yes Shawnee Knapp, MD  PROAIR HFA 108 [phone] BASE) MCG/ACT inhaler INHALE 2 PUFFS INTO THE LUNGS EVERY 6 HOURS AS NEEDED 08/24/14  Yes Shawnee Knapp, MD   History   Social History  . Marital Status: Married    Spouse Name: N/A    Number of Children: N/A  . Years of Education: N/A   Occupational History  . Not on file.   Social History Main Topics  . Smoking status: Never Smoker   . Smokeless tobacco: Not on file  . Alcohol Use: No  . Drug Use: No  . Sexual Activity: Yes    Birth Control/ Protection: Condom   Other Topics Concern  . Not on file   Social History Narrative    Review of Systems  Constitutional: Negative for fever and chills.  Gastrointestinal: Positive for abdominal pain.  Genitourinary: Positive for dysuria and urgency. Negative for flank pain.       Objective:   Physical Exam  Nursing note and vitals reviewed.   CONSTITUTIONAL: Well developed/well nourished HEAD: Normocephalic/atraumatic EYES: EOMI/PERRL ENMT: Mucous membranes moist NECK: supple no meningeal signs SPINE/BACK:entire spine nontender CV: S1/S2 noted, no murmurs/rubs/gallops noted LUNGS: Lungs are clear to auscultation bilaterally, no apparent distress ABDOMEN: soft, no rebound or guarding, bowel sounds noted throughout abdomen; no CVA pain or tenderness, mild suprapubic tenderness, no masses GU:no cva tenderness NEURO: Pt is awake/alert/appropriate, moves all extremitiesx4.  No facial droop.  EXTREMITIES: pulses normal/equal, full ROM SKIN: warm, color normal PSYCH: no abnormalities of mood noted, alert and oriented to situation  Filed Vitals:   09/07/14 0942  BP: 110/70  Pulse: 57  Temp: 98.9 F (37.2 C)  TempSrc: Oral  Resp: 16  Height: 5' 4"  (1.626 m)  Weight: 209 lb (94.802 kg)  SpO2: 99%   Results for orders placed or performed in visit on 09/07/14  POCT UA - Microscopic Only  Result Value Ref Range   WBC, Ur, HPF, POC 1-4    RBC, urine,  microscopic 1-3    Bacteria, U Microscopic NEG    Mucus, UA NEG    Epithelial cells, urine per micros 0-2    Crystals, Ur, HPF, POC NEG    Casts, Ur, LPF, POC NEG    Yeast, UA NEG   POCT urinalysis dipstick  Result Value Ref Range   Color, UA YELLOW    Clarity, UA CLEAR    Glucose, UA NEG    Bilirubin, UA NEG    Ketones, UA NEG    Spec Grav, UA <=1.005    Blood, UA NEG    pH, UA 5.5    Protein, UA NEG    Urobilinogen, UA 0.2    Nitrite, UA NEG    Leukocytes, UA Negative         Assessment & Plan:  Urine has only 1-4 white cells 1-3 red cells negative nitrite. Advised patient to take Pyridium 3 times a day culture was done. I did review the patient's ultrasound with her which shows a 10 mm probable hemangioma right lobe of the liver. I did suggest that she requests follow-up of this with Dr. Brigitte Pulse.  I personally performed the services described in this documentation, which was scribed in my presence. The recorded information has been reviewed and is accurate.

## 2014-09-07 NOTE — Telephone Encounter (Signed)
No evidence of gallstones or fatty infiltration (deposits) in the liver. There is what appears to be a hemangioma (a cluster of blood vessels, usually benign).   Dr. Brigitte Pulse will contact her with any additional advice/plans.

## 2014-09-09 LAB — URINE CULTURE: Colony Count: 35000

## 2014-09-11 NOTE — Telephone Encounter (Signed)
Dr. Everlene Farrier discussed the results with pt at last office visit

## 2014-09-13 ENCOUNTER — Telehealth: Payer: Self-pay

## 2014-09-13 MED ORDER — CEPHALEXIN 500 MG PO CAPS: 500.0000 mg | ORAL_CAPSULE | Freq: Two times a day (BID) | ORAL | Status: DC

## 2014-09-13 NOTE — Telephone Encounter (Signed)
See labs. Pt changed her mind. Now wants abx. Sent in for her

## 2014-09-16 ENCOUNTER — Ambulatory Visit: Payer: Self-pay | Admitting: Dietician

## 2014-09-19 ENCOUNTER — Encounter: Payer: BLUE CROSS/BLUE SHIELD | Attending: General Surgery | Admitting: Dietician

## 2014-09-19 ENCOUNTER — Encounter: Payer: Self-pay | Admitting: Dietician

## 2014-09-19 VITALS — Ht 61.75 in | Wt 206.4 lb

## 2014-09-19 DIAGNOSIS — Z6838 Body mass index (BMI) 38.0-38.9, adult: Secondary | ICD-10-CM | POA: Diagnosis not present

## 2014-09-19 DIAGNOSIS — E669 Obesity, unspecified: Secondary | ICD-10-CM | POA: Diagnosis not present

## 2014-09-19 DIAGNOSIS — Z01818 Encounter for other preprocedural examination: Secondary | ICD-10-CM | POA: Diagnosis not present

## 2014-09-19 NOTE — Progress Notes (Signed)
  Pre-Op Assessment Visit:  Pre-Operative LAGB Surgery  Medical Nutrition Therapy:  Appt start time: 2703   End time:  5009.  Patient was seen on 09/19/2014 for Pre-Operative Nutrition Assessment. Assessment and letter of approval faxed to [facility] Bariatric Surgery Program coordinator on 09/19/2014.   Preferred Learning Style:   No preference indicated   Learning Readiness:   Ready  Handouts given during visit include:  Pre-Op Goals Bariatric Surgery Protein Shakes   During the appointment today the following Pre-Op Goals were reviewed with the patient: Maintain or lose weight as instructed by your surgeon Make healthy food choices Begin to limit portion sizes Limited concentrated sugars and fried foods Keep fat/sugar in the single digits per serving on   food labels Practice CHEWING your food  (aim for 30 chews per bite or until applesauce consistency) Practice not drinking 15 minutes before, during, and 30 minutes after each meal/snack Avoid all carbonated beverages  Avoid/limit caffeinated beverages  Avoid all sugar-sweetened beverages Consume 3 meals per day; eat every 3-5 hours Make a list of non-food related activities Aim for 64-100 ounces of FLUID daily  Aim for at least 60-80 grams of PROTEIN daily Look for a liquid protein source that contain ?15 g protein and ?5 g carbohydrate  (ex: shakes, drinks, shots)  Patient-Centered Goals: Selena Chapman would like to be able to do more activities without feeling winded. Overall feeling confident that she will be able to follow the Pre-Op goals.  Demonstrated degree of understanding via:  Teach Back  Teaching Method Utilized:  Visual Auditory Hands on  Barriers to learning/adherence to lifestyle change: none  [facility] to call the [facility] to enroll in Pre-Op and Post-Op Nutrition Education when surgery date is scheduled.

## 2014-09-19 NOTE — Patient Instructions (Signed)
Follow Pre-Op Goals Try Protein Shakes Call [facility] at [phone] when surgery is scheduled to enroll in Pre-Op Class  Things to remember:  Please always be honest with [place]. We want to support you!  If you have any questions or concerns in between appointments, please call or email Ferol Luz, or Margarita Grizzle.  The diet after surgery will be high protein and low in carbohydrate.  Vitamins and calcium need to be taken for the rest of your life.  Feel free to include support people in any classes or appointments.

## 2014-10-20 ENCOUNTER — Ambulatory Visit (INDEPENDENT_AMBULATORY_CARE_PROVIDER_SITE_OTHER): Payer: Self-pay | Admitting: General Surgery

## 2015-06-25 ENCOUNTER — Other Ambulatory Visit: Payer: Self-pay | Admitting: Family Medicine

## 2015-06-27 ENCOUNTER — Telehealth: Payer: Self-pay

## 2015-06-27 NOTE — Telephone Encounter (Signed)
Rx sent in today.

## 2015-06-27 NOTE — Telephone Encounter (Signed)
Pt is wondering if she can have a renewal of her MICROGESTIN FE 1.5/30 1.5-30 MG-MCG tablet [[id number]]. She has an appointment set up for 12/29. CB # [phone]

## 2015-08-24 ENCOUNTER — Ambulatory Visit: Payer: BLUE CROSS/BLUE SHIELD | Admitting: Family Medicine

## 2015-09-14 ENCOUNTER — Encounter: Payer: Self-pay | Admitting: Family Medicine

## 2015-09-14 ENCOUNTER — Ambulatory Visit (INDEPENDENT_AMBULATORY_CARE_PROVIDER_SITE_OTHER): Payer: BLUE CROSS/BLUE SHIELD | Admitting: Family Medicine

## 2015-09-14 VITALS — BP 137/84 | HR 67 | Temp 98.6°F | Resp 16 | Ht 62.5 in | Wt 189.0 lb

## 2015-09-14 DIAGNOSIS — R7989 Other specified abnormal findings of blood chemistry: Secondary | ICD-10-CM | POA: Diagnosis not present

## 2015-09-14 DIAGNOSIS — E559 Vitamin D deficiency, unspecified: Secondary | ICD-10-CM

## 2015-09-14 DIAGNOSIS — Z23 Encounter for immunization: Secondary | ICD-10-CM | POA: Diagnosis not present

## 2015-09-14 DIAGNOSIS — Z3009 Encounter for other general counseling and advice on contraception: Secondary | ICD-10-CM

## 2015-09-14 DIAGNOSIS — Z5181 Encounter for therapeutic drug level monitoring: Secondary | ICD-10-CM

## 2015-09-14 DIAGNOSIS — R945 Abnormal results of liver function studies: Secondary | ICD-10-CM

## 2015-09-14 LAB — COMPREHENSIVE METABOLIC PANEL
ALT: 18 U/L (ref 6–29)
AST: 19 U/L (ref 10–30)
Albumin: 3.9 g/dL (ref 3.6–5.1)
Alkaline Phosphatase: 36 U/L (ref 33–115)
BUN: 15 mg/dL (ref 7–25)
CALCIUM: 9.3 mg/dL (ref 8.6–10.2)
CHLORIDE: 105 mmol/L (ref 98–110)
CO2: 24 mmol/L (ref 20–31)
Creat: 0.71 mg/dL (ref 0.50–1.10)
Glucose, Bld: 82 mg/dL (ref 65–99)
Potassium: 4 mmol/L (ref 3.5–5.3)
Sodium: 137 mmol/L (ref 135–146)
Total Bilirubin: 0.4 mg/dL (ref 0.2–1.2)
Total Protein: 6.5 g/dL (ref 6.1–8.1)

## 2015-09-14 MED ORDER — NORETHIN ACE-ETH ESTRAD-FE 1.5-30 MG-MCG PO TABS: 1.0000 | ORAL_TABLET | Freq: Every day | ORAL | Status: DC

## 2015-09-14 NOTE — Progress Notes (Addendum)
Subjective:    Patient ID: Selena Chapman, female    DOB: Sep 17, 1970, 45 y.o.   MRN: [id number] Chief Complaint  Patient presents with  . Medication Refill    HPI   Pap nml with neg HPV 04/2013 so repeat in 2019. No h/o abnml  Vit D def - she has stopped taking doesn't remember when or why.  occ taking mvi.  Lap band - could not afford as $6500 so has last 30 lbs this yr through common sense and getting exercise around the house.  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 Donzetta Sprung practice - added on a little sertraline.  Doing well on OCPs.  Has not needed to use inhin over a yr No drinking and no tob. Rare hemorrhoid.  Past Medical History  Diagnosis Date  . Depression   . Asthma   . Allergy    Past Surgical History  Procedure Laterality Date  . Appendectomy     Current Outpatient Prescriptions on File Prior to Visit  Medication Sig Dispense Refill  . clonazePAM (KLONOPIN) 0.5 MG tablet Take 0.5 mg by mouth 2 (two) times daily as needed for anxiety.    . lamoTRIgine (LAMICTAL) 25 MG tablet Take 25 mg by mouth daily.    Marland Kitchen PROAIR HFA 108 (90 BASE) MCG/ACT inhaler INHALE 2 PUFFS INTO THE LUNGS EVERY 6 HOURS AS NEEDED 8.5 g 3   No current facility-administered medications on file prior to visit.   No Known Allergies Family History  Problem Relation Age of Onset  . Hypertension Mother   . Depression Sister    Social History   Social History  . Marital Status: Married    Spouse Name: N/A  . Number of Children: N/A  . Years of Education: N/A   Social History Main Topics  . Smoking status: Never Smoker   . Smokeless tobacco: None  . Alcohol Use: No  . Drug Use: No  . Sexual Activity: Yes    Birth Control/ Protection: Condom   Other Topics Concern  . None   Social History Narrative   Depression screen [facility] 2/9 09/14/2015 08/05/2014  Decreased  Interest 0 1  Down, Depressed, Hopeless 0 0  PHQ - 2 Score 0 1      Review of Systems  Constitutional: Positive for activity change. Negative for fever, appetite change and unexpected weight change.  Respiratory: Negative for cough, chest tightness, shortness of breath and wheezing.   Gastrointestinal: Positive for anal bleeding. Negative for nausea, vomiting, abdominal pain, diarrhea, blood in stool, abdominal distention and rectal pain.  Genitourinary: Negative for menstrual problem and pelvic pain.  Neurological: Negative for dizziness, syncope, weakness, light-headedness and headaches.  Psychiatric/Behavioral: Negative for sleep disturbance and dysphoric mood. The patient is not nervous/anxious.        Objective:  BP 137/84 mmHg  Pulse 67  Temp(Src) 98.6 F (37 C)  Resp 16  Ht 5' 2.5" (1.588 m)  Wt 189 lb (85.73 kg)  BMI 34.00 kg/m2  Physical Exam  Constitutional: She is oriented to person, place, and time. She appears well-developed and well-nourished. No distress.  HENT:  Head: Normocephalic and atraumatic.  Right Ear: External ear normal.  Left Ear: External ear normal.  Eyes: Conjunctivae are normal. No scleral icterus.  Neck: Normal range of motion. Neck supple. No thyromegaly present.  Cardiovascular: Normal rate, regular rhythm, normal  heart sounds and intact distal pulses.   Pulmonary/Chest: Effort normal and breath sounds normal. No respiratory distress.  Musculoskeletal: She exhibits no edema.  Lymphadenopathy:    She has no cervical adenopathy.  Neurological: She is alert and oriented to person, place, and time.  Skin: Skin is warm and dry. She is not diaphoretic. No erythema.  Psychiatric: She has a normal mood and affect. Her behavior is normal.      Assessment & Plan:   1. Need for immunization against influenza   2. Family planning counseling   3. Vitamin D deficiency   4. Elevated LFTs   5. Medication monitoring encounter     Orders Placed This  Encounter  Procedures  . Flu Vaccine QUAD 36+ mos IM (Fluarix)  . Comprehensive metabolic panel  . VITAMIN D 25 Hydroxy (Vit-D Deficiency, Fractures)    Meds ordered this encounter  Medications  . norethindrone-ethinyl estradiol-iron (MICROGESTIN FE 1.5/30) 1.5-30 MG-MCG tablet    Sig: Take 1 tablet by mouth daily.    Dispense:  84 tablet    Refill:  4    Delman Cheadle, MD MPH

## 2015-09-14 NOTE — Patient Instructions (Addendum)
[facility] has a new weight loss clinic in [place] on [address]. So far, they seem to be getting good results and since it is medically based your insurance might provide better coverage.  It is probably worth checking to see what your ins coverage is. It is called [employer] and you can call [phone] option 1 for more info.   Oral Contraception Information Oral contraceptive pills (OCPs) are medicines taken to prevent pregnancy. OCPs work by preventing the ovaries from releasing eggs. The hormones in OCPs also cause the cervical mucus to thicken, preventing the sperm from entering the uterus. The hormones also cause the uterine lining to become thin, not allowing a fertilized egg to attach to the inside of the uterus. OCPs are highly effective when taken exactly as prescribed. However, OCPs do not prevent sexually transmitted diseases (STDs). Safe sex practices, such as using condoms along with the pill, can help prevent STDs.  Before taking the pill, you may have a physical exam and Pap test. Your health care provider may order blood tests. The health care provider will make sure you are a good candidate for oral contraception. Discuss with your health care provider the possible side effects of the OCP you may be prescribed. When starting an OCP, it can take 2 to 3 months for the body to adjust to the changes in hormone levels in your body.  TYPES OF ORAL CONTRACEPTION  The combination pill--This pill contains estrogen and progestin (synthetic progesterone) hormones. The combination pill comes in 21-day, 28-day, or 91-day packs. Some types of combination pills are meant to be taken continuously (365-day pills). With 21-day packs, you do not take pills for 7 days after the last pill. With 28-day packs, the pill is taken every day. The last 7 pills are without hormones. Certain types of pills have more than 21 hormone-containing pills. With 91-day packs, the first 84 pills contain  both hormones, and the last 7 pills contain no hormones or contain estrogen only.  The minipill--This pill contains the progesterone hormone only. The pill is taken every day continuously. It is very important to take the pill at the same time each day. The minipill comes in packs of 28 pills. All 28 pills contain the hormone.  ADVANTAGES OF ORAL CONTRACEPTIVE PILLS  Decreases premenstrual symptoms.   Treats menstrual period cramps.   Regulates the menstrual cycle.   Decreases a heavy menstrual flow.   May treatacne, depending on the type of pill.   Treats abnormal uterine bleeding.   Treats polycystic ovarian syndrome.   Treats endometriosis.   Can be used as emergency contraception.  THINGS THAT CAN MAKE ORAL CONTRACEPTIVE PILLS LESS EFFECTIVE OCPs can be less effective if:   You forget to take the pill at the same time every day.   You have a stomach or intestinal disease that lessens the absorption of the pill.   You take OCPs with other medicines that make OCPs less effective, such as antibiotics, certain HIV medicines, and some seizure medicines.   You take expired OCPs.   You forget to restart the pill on day 7, when using the packs of 21 pills.  RISKS ASSOCIATED WITH ORAL CONTRACEPTIVE PILLS  Oral contraceptive pills can sometimes cause side effects, such as:  Headache.  Nausea.  Breast tenderness.  Irregular bleeding or spotting. Combination pills are also associated with a small increased risk of:  Blood clots.  Heart attack.  Stroke.   This information is not  intended to replace advice given to you by your health care provider. Make sure you discuss any questions you have with your health care provider.   Document Released: 11/02/2002 Document Revised: 06/02/2013 Document Reviewed: 01/31/2013 Elsevier Interactive Patient Education 2016 [place].  Oral Contraception Use Oral contraceptive pills (OCPs) are medicines taken to  prevent pregnancy. OCPs work by preventing the ovaries from releasing eggs. The hormones in OCPs also cause the cervical mucus to thicken, preventing the sperm from entering the uterus. The hormones also cause the uterine lining to become thin, not allowing a fertilized egg to attach to the inside of the uterus. OCPs are highly effective when taken exactly as prescribed. However, OCPs do not prevent sexually transmitted diseases (STDs). Safe sex practices, such as using condoms along with an OCP, can help prevent STDs. Before taking OCPs, you may have a physical exam and Pap test. Your health care provider may also order blood tests if necessary. Your health care provider will make sure you are a good candidate for oral contraception. Discuss with your health care provider the possible side effects of the OCP you may be prescribed. When starting an OCP, it can take 2 to 3 months for the body to adjust to the changes in hormone levels in your body.  HOW TO TAKE ORAL CONTRACEPTIVE PILLS Your health care provider may advise you on how to start taking the first cycle of OCPs. Otherwise, you can:   Start on day 1 of your menstrual period. You will not need any backup contraceptive protection with this start time.   Start on the first Sunday after your menstrual period or the day you get your prescription. In these cases, you will need to use backup contraceptive protection for the first week.   Start the pill at any time of your cycle. If you take the pill within 5 days of the start of your period, you are protected against pregnancy right away. In this case, you will not need a backup form of birth control. If you start at any other time of your menstrual cycle, you will need to use another form of birth control for 7 days. If your OCP is the type called a minipill, it will protect you from pregnancy after taking it for 2 days (48 hours). After you have started taking OCPs:   If you forget to take 1 pill,  take it as soon as you remember. Take the next pill at the regular time.   If you miss 2 or more pills, call your health care provider because different pills have different instructions for missed doses. Use backup birth control until your next menstrual period starts.   If you use a 28-day pack that contains inactive pills and you miss 1 of the last 7 pills (pills with no hormones), it will not matter. Throw away the rest of the non-hormone pills and start a new pill pack.  No matter which day you start the OCP, you will always start a new pack on that same day of the week. Have an extra pack of OCPs and a backup contraceptive method available in case you miss some pills or lose your OCP pack.  HOME CARE INSTRUCTIONS   Do not smoke.   Always use a condom to protect against STDs. OCPs do not protect against STDs.   Use a calendar to mark your menstrual period days.   Read the information and directions that came with your OCP. Talk to your health  care provider if you have questions.  SEEK MEDICAL CARE IF:   You develop nausea and vomiting.   You have abnormal vaginal discharge or bleeding.   You develop a rash.   You miss your menstrual period.   You are losing your hair.   You need treatment for mood swings or depression.   You get dizzy when taking the OCP.   You develop acne from taking the OCP.   You become pregnant.  SEEK IMMEDIATE MEDICAL CARE IF:   You develop chest pain.   You develop shortness of breath.   You have an uncontrolled or severe headache.   You develop numbness or slurred speech.   You develop visual problems.   You develop pain, redness, and swelling in the legs.    This information is not intended to replace advice given to you by your health care provider. Make sure you discuss any questions you have with your health care provider.   Document Released: 08/01/2011 Document Revised: 09/02/2014 Document Reviewed:  01/31/2013 Elsevier Interactive Patient Education [employer].

## 2015-09-15 LAB — VITAMIN D 25 HYDROXY (VIT D DEFICIENCY, FRACTURES): Vit D, 25-Hydroxy: 14 ng/mL — ABNORMAL LOW (ref 30–100)

## 2015-09-19 ENCOUNTER — Telehealth: Payer: Self-pay

## 2015-09-19 NOTE — Telephone Encounter (Signed)
Vit D def but cmp nml. Will send mychart note.

## 2015-09-19 NOTE — Telephone Encounter (Signed)
Pt calling about labs. Please review. Thanks

## 2015-09-20 ENCOUNTER — Encounter: Payer: Self-pay | Admitting: Family Medicine

## 2015-09-20 ENCOUNTER — Other Ambulatory Visit: Payer: Self-pay | Admitting: Family Medicine

## 2015-09-20 DIAGNOSIS — E559 Vitamin D deficiency, unspecified: Secondary | ICD-10-CM

## 2015-09-20 MED ORDER — ERGOCALCIFEROL 1.25 MG (50000 UT) PO CAPS: 50000.0000 [IU] | ORAL_CAPSULE | ORAL | Status: DC

## 2015-12-14 DIAGNOSIS — F3181 Bipolar II disorder: Secondary | ICD-10-CM | POA: Diagnosis not present

## 2015-12-18 ENCOUNTER — Encounter: Payer: Self-pay | Admitting: Family Medicine

## 2016-04-15 IMAGING — RF DG UGI W/ KUB
14 series · 14 of 14 positions shown · non-contrast
Comparison: None.

CLINICAL DATA: Morbid obesity. Pre-op evaluation for bariatric
surgery.

EXAM:
UPPER GI SERIES WITH KUB
TECHNIQUE: After obtaining a scout radiograph a routine upper GI series was
performed using thin barium.
FLUOROSCOPY TIME:  1 min 30 seconds

[Series 1: run · 1 of 1 slices shown (1 of 12)]
[im 1/1]
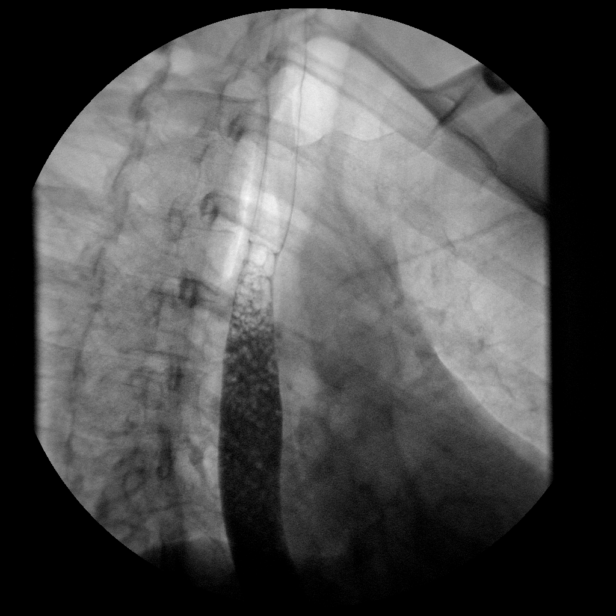

[Series 2: run · 1 of 1 slices shown (2 of 12)]
[im 1/1]
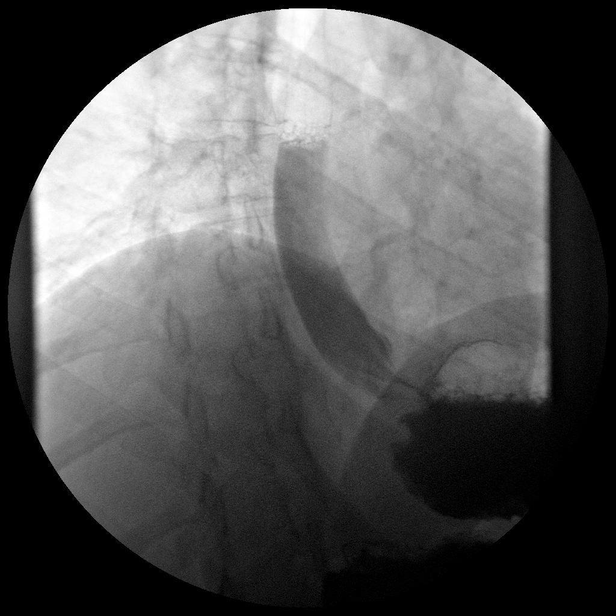

[Series 3: run · 1 of 1 slices shown (3 of 12)]
[im 1/1]
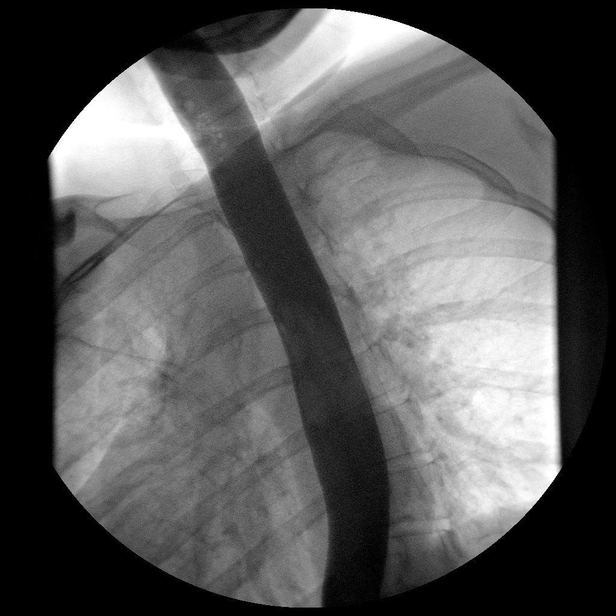

[Series 4: run · 1 of 1 slices shown (4 of 12)]
[im 1/1]
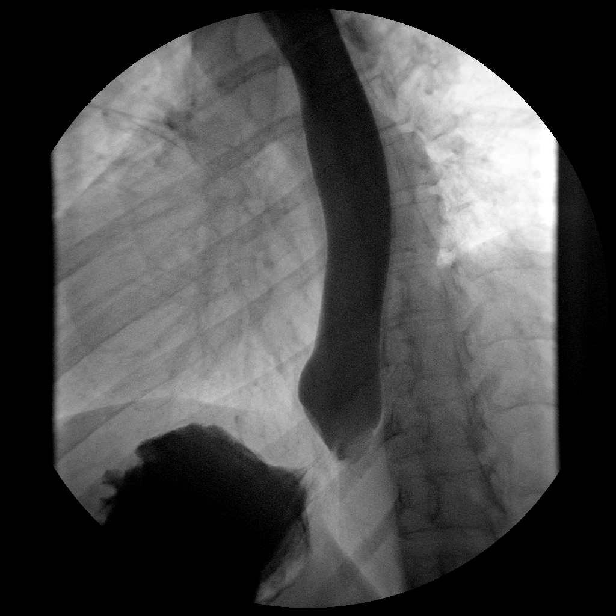

[Series 5: run · 1 of 1 slices shown (5 of 12)]
[im 1/1]
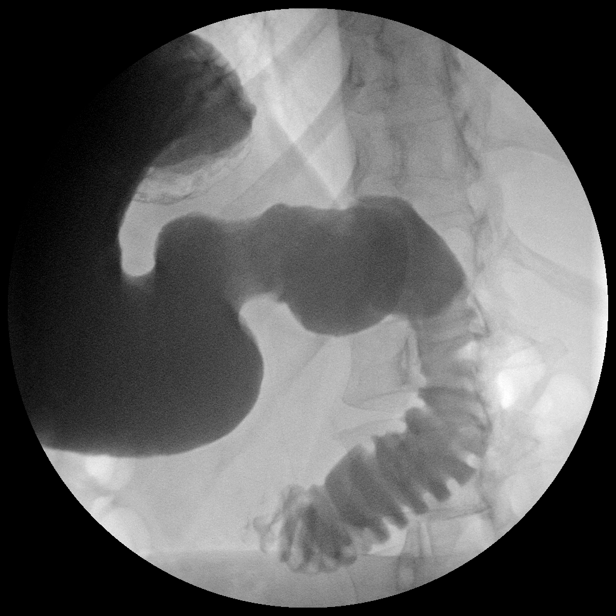

[Series 6: run · 1 of 1 slices shown (6 of 12)]
[im 1/1]
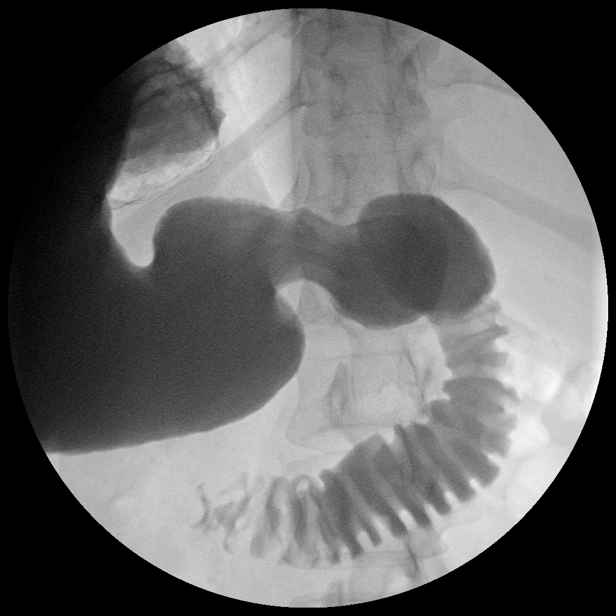

[Series 7: run · 1 of 1 slices shown (7 of 12)]
[im 1/1]
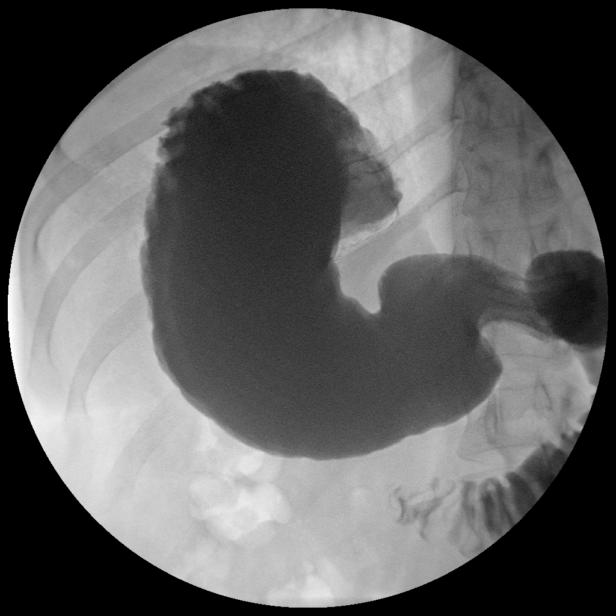

[Series 8: run · 1 of 1 slices shown (8 of 12)]
[im 1/1]
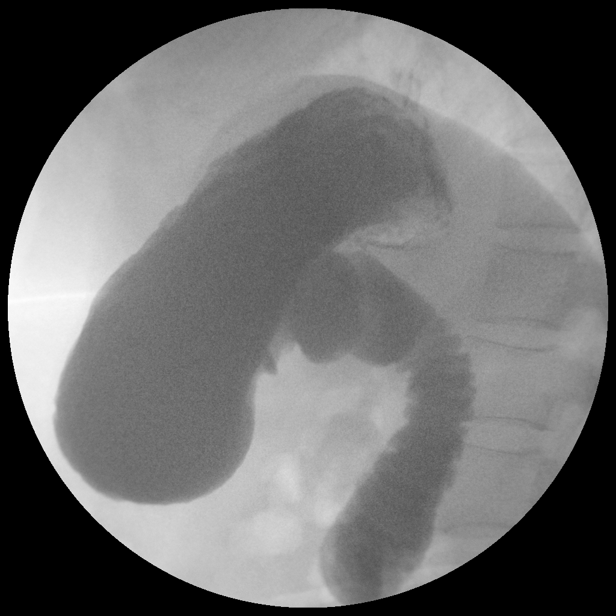

[Series 9: run · 1 of 1 slices shown (9 of 12)]
[im 1/1]
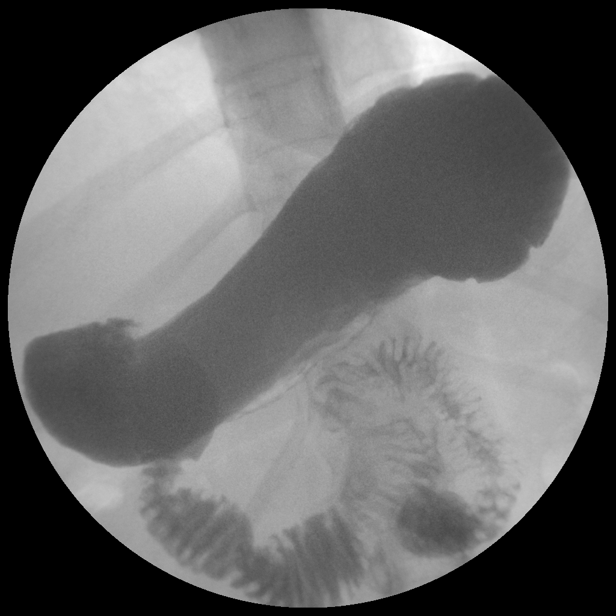

[Series 10: run · 1 of 1 slices shown (10 of 12)]
[im 1/1]
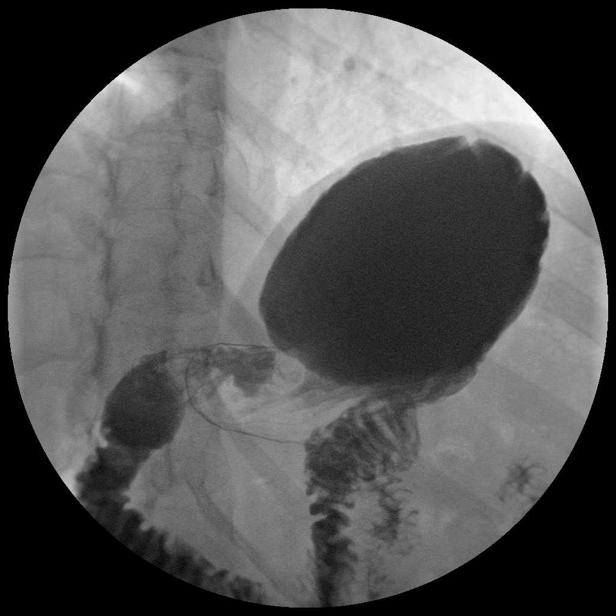

[Series 11: run · 1 of 1 slices shown (11 of 12)]
[im 1/1]
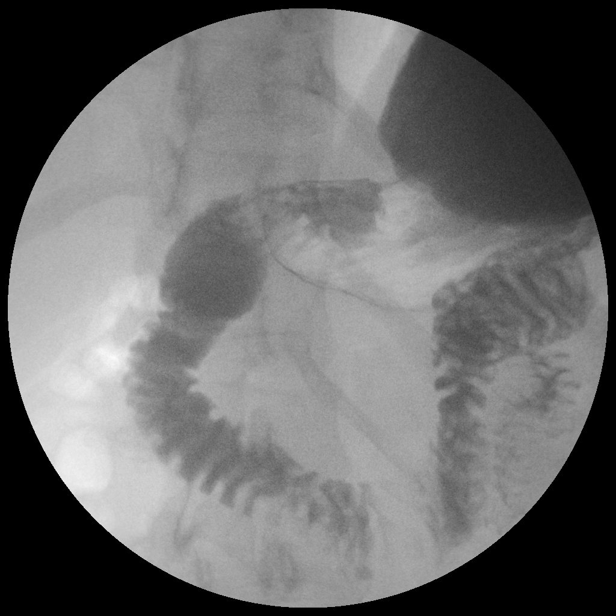

[Series 12: run · 1 of 1 slices shown (12 of 12)]
[im 1/1]
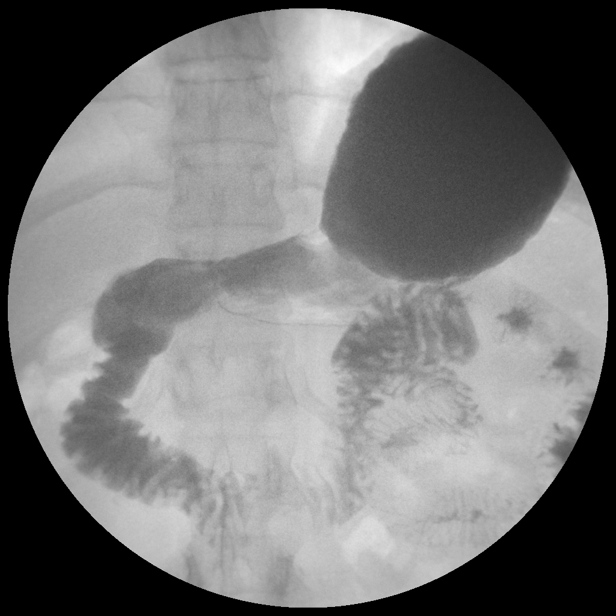

[Series 1001: view not recorded · 0.20mm/px · 1 of 1 slices shown (1 of 2)]
[im 1/1]
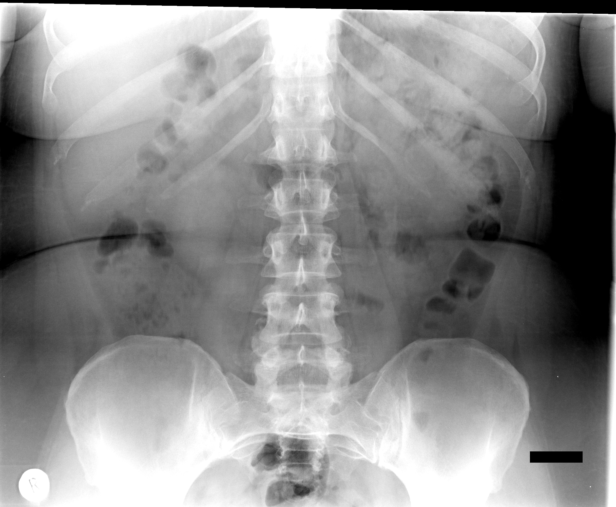

[Series 1002: view not recorded · 0.20mm/px · 1 of 1 slices shown (2 of 2)]
[im 1/1]
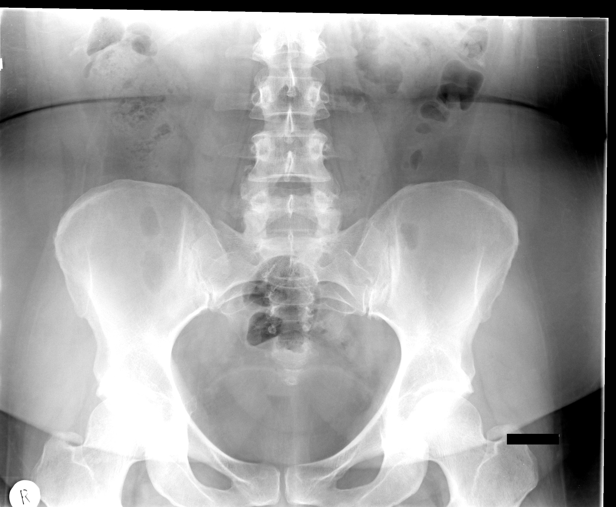

[14 of 14 positions shown; findings below may reference images not displayed]

FINDINGS: Scout radiograph:  Unremarkable bowel gas pattern.

Esophagus: No evidence of esophageal mass or stricture. Motility is
within normal limits. No gastroesophageal reflux observed.

Stomach: No hiatal hernia visualized. No evidence of gastric mass or
ulcer.

Duodenum: No ulcer or other significant abnormality seen involving
duodenal bulb or sweep.

Other:  None.
IMPRESSION: Negative upper GI series.

## 2016-05-13 DIAGNOSIS — F3132 Bipolar disorder, current episode depressed, moderate: Secondary | ICD-10-CM | POA: Diagnosis not present

## 2016-05-14 DIAGNOSIS — Z23 Encounter for immunization: Secondary | ICD-10-CM | POA: Diagnosis not present

## 2016-07-15 ENCOUNTER — Other Ambulatory Visit: Payer: Self-pay | Admitting: Family Medicine

## 2016-09-03 ENCOUNTER — Encounter: Payer: Self-pay | Admitting: Physician Assistant

## 2016-09-03 ENCOUNTER — Ambulatory Visit (INDEPENDENT_AMBULATORY_CARE_PROVIDER_SITE_OTHER): Payer: BLUE CROSS/BLUE SHIELD | Admitting: Physician Assistant

## 2016-09-03 VITALS — BP 138/84 | HR 88 | Temp 98.3°F | Ht 62.5 in | Wt 209.2 lb

## 2016-09-03 DIAGNOSIS — R591 Generalized enlarged lymph nodes: Secondary | ICD-10-CM

## 2016-09-03 DIAGNOSIS — J0100 Acute maxillary sinusitis, unspecified: Secondary | ICD-10-CM

## 2016-09-03 MED ORDER — AMOXICILLIN 875 MG PO TABS: 875.0000 mg | ORAL_TABLET | Freq: Two times a day (BID) | ORAL | 0 refills | Status: DC

## 2016-09-03 MED ORDER — GUAIFENESIN ER 1200 MG PO TB12: 1.0000 | ORAL_TABLET | Freq: Two times a day (BID) | ORAL | 0 refills | Status: AC

## 2016-09-03 MED ORDER — NORETHIN ACE-ETH ESTRAD-FE 1.5-30 MG-MCG PO TABS: 1.0000 | ORAL_TABLET | Freq: Every day | ORAL | 0 refills | Status: DC

## 2016-09-03 NOTE — Progress Notes (Signed)
Selena Chapman  MRN: [id number] DOB: [birth year]  Subjective:  Pt presents to clinic with 2 week h/o cold symptoms.  She has been doing home treatment and felt like she was getting better until 2 days ago when she started to feel worse.  She has noticed that she is just really tired and no energy.  She is not sleeping great due to cough but she is sleeping ok.  She is coughing some but it is from the back of her throat.  She has h/o asthma and is having to use her inhaler for wheezing at night but it is working ok.  She has used some nyquil.  She knows she feels better when she drinks a lot of fluids.  Review of Systems  Constitutional: Negative for chills and fever.  HENT: Positive for congestion, postnasal drip and rhinorrhea (minimal - clear/green with slight blood). Negative for dental problem.   Respiratory: Positive for cough and wheezing (mainly at night). Negative for shortness of breath.        H/o asthma, nonsmoker  Gastrointestinal: Negative.   Neurological: Positive for headaches. Negative for dizziness.  Psychiatric/Behavioral: Positive for sleep disturbance (slightly disrupted from cough).    Patient Active Problem List   Diagnosis Date Noted  . Vitamin D deficiency 08/11/2014  . Hyperlipidemia 08/11/2014  . Obesity 08/11/2014  . Asthma 09/23/2011  . Environmental allergies 09/23/2011  . Depression     Current Outpatient Prescriptions on File Prior to Visit  Medication Sig Dispense Refill  . ergocalciferol (DRISDOL) 50000 units capsule Take 1 capsule (50,000 Units total) by mouth once a week. 12 capsule 1  . PROAIR HFA 108 (90 BASE) MCG/ACT inhaler INHALE 2 PUFFS INTO THE LUNGS EVERY 6 HOURS AS NEEDED 8.5 g 3   No current facility-administered medications on file prior to visit.     No Known Allergies  Pt patients past, family and social history were reviewed and updated.   Objective:  BP 138/84 (BP Location: Right Arm, Patient Position: Sitting, Cuff  Size: Large)   Pulse 88   Temp 98.3 F (36.8 C) (Oral)   Ht 5' 2.5" (1.588 m)   Wt 209 lb 3.2 oz (94.9 kg)   SpO2 97%   BMI 37.65 kg/m   Physical Exam  Constitutional: She is oriented to person, place, and time and well-developed, well-nourished, and in no distress.  HENT:  Head: Normocephalic and atraumatic.  Right Ear: Hearing, tympanic membrane, external ear and ear canal normal.  Left Ear: Hearing, tympanic membrane, external ear and ear canal normal.  Nose: Nose normal.  Mouth/Throat: Uvula is midline, oropharynx is clear and moist and mucous membranes are normal.  Eyes: Conjunctivae are normal.  Neck: Normal range of motion.  Cardiovascular: Normal rate, regular rhythm and normal heart sounds.   No murmur heard. Pulmonary/Chest: Effort normal and breath sounds normal. She has no wheezes.  Lymphadenopathy:       Head (left side): Posterior auricular (tender) adenopathy present.  Neurological: She is alert and oriented to person, place, and time. Gait normal.  Skin: Skin is warm and dry.  Psychiatric: Mood, memory, affect and judgment normal.  Vitals reviewed.   Assessment and Plan :  Acute non-recurrent maxillary sinusitis - Plan: Guaifenesin (MUCINEX MAXIMUM STRENGTH) 1200 MG TB12, amoxicillin (AMOXIL) 875 MG tablet  Lymphadenopathy - pt to monitor as it is hard - if does not resolve or improve in the next week she needs to be rechecked.  Windell Hummingbird PA-C  [facility] 09/03/2016 12:20 PM

## 2016-09-03 NOTE — Patient Instructions (Addendum)
  Please push fluids.  Tylenol and Motrin for fever and body aches.    A humidifier can help especially when the air is dry -if you do not have a humidifier you can boil a pot of water on the stove in your home to help with the dry air.  Nasal saline spray can be helpful to keep the mucus membranes moist and thin the nasal mucus    IF you received an x-ray today, you will receive an invoice from [facility]. Please contact [facility] at [phone] with questions or concerns regarding your invoice.   IF you received labwork today, you will receive an invoice from [facility]. Please contact [facility] at [phone] with questions or concerns regarding your invoice.   Our billing staff will not be able to assist you with questions regarding bills from these companies.  You will be contacted with the lab results as soon as they are available. The fastest way to get your results is to activate your My Chart account. Instructions are located on the last page of this paperwork. If you have not heard from [place] regarding the results in 2 weeks, please contact this office.

## 2016-12-16 ENCOUNTER — Encounter: Payer: BLUE CROSS/BLUE SHIELD | Admitting: Family Medicine

## 2017-01-06 ENCOUNTER — Ambulatory Visit: Payer: BLUE CROSS/BLUE SHIELD | Admitting: Family Medicine

## 2017-01-28 ENCOUNTER — Other Ambulatory Visit: Payer: Self-pay | Admitting: Family Medicine

## 2017-01-28 NOTE — Telephone Encounter (Signed)
Last bcp monitoring 08/2015, does she need a visit?

## 2017-01-28 NOTE — Telephone Encounter (Signed)
PT CALLING FOR A REFILL ON HER BIRTH CONTROL

## 2017-01-29 MED ORDER — NORETHIN ACE-ETH ESTRAD-FE 1.5-30 MG-MCG PO TABS: 1.0000 | ORAL_TABLET | Freq: Every day | ORAL | 0 refills | Status: DC

## 2017-01-29 NOTE — Telephone Encounter (Signed)
Pt has appt 7/12. Needs to keep that appt for any further refills

## 2017-01-30 ENCOUNTER — Ambulatory Visit: Payer: BLUE CROSS/BLUE SHIELD | Admitting: Family Medicine

## 2017-03-05 NOTE — Progress Notes (Addendum)
Subjective:    Patient ID: Selena Chapman, female    DOB: 12-28-1970, 46 y.o.   MRN: [id number] Chief Complaint  Patient presents with  . Gynecologic Exam  . Medication Refill    BC     HPI  Selena Chapman is a delightful 46 year old woman here today for a refill on her birth control pills. I last saw patient approximately 18 months prior.   Family planning: on OCPs. No problems with it. Does wonder if she is getting perimenopausal symptoms. Has been a little moodier than normal. She is doing back-to-back pill packs so only has a period every 3-4 mos and doesn't remember how consistent that is. She gets more cyclical headaches on the top of her head on the 3rd wk of the birth control pills with premenstrual changes.  Also noticed a build up of bumps on her scalp like when she had similar in high school. .  Pap done September 2014 - 4 years prior -  was normal with negative high risk HPV so repeat in 2019. No h/o abnml. Denies any STI risk, sxs, or need for testing  Seasonal allergies: prn pro-air used very rarely - liked to keep it around in case. Allergic to everything (including her cat) so was severe this year but fine now  Vitamin D deficiency:  Finished rx and then switched to otc high dose but not used in a while.; Lab Results  Component Value Date   VD25OH 14 (L) 09/14/2015   VD25OH 17 (L) 08/05/2014   Obesity: Is wanting to pursue lap band prior but cost $6500 out of pocket.   H/o elev transaminasis resolved at least check Jan 2017 with obesity and high chol (LDL 187 2015), neg hep C, RUQ [place] nml in 2016 (showed no fatty liver but did have 66m hemangioma).    Mood d/o - was followed by Dr. BLita Mains[facility] - added on a little sertraline.  Does increase the zoloft dose during the premenstrual week. She is now being seen by a new provider in a solo practice - DShanon Brow  Didn't sleep well last night so thinks it is making BP high.   Past Medical History:    Diagnosis Date  . Allergy   . Asthma   . Depression    Past Surgical History:  Procedure Laterality Date  . APPENDECTOMY     Current Outpatient Prescriptions on File Prior to Visit  Medication Sig Dispense Refill  . lamoTRIgine (LAMICTAL) 100 MG tablet TK 2 AND 1/2 TS PO QD  0  . sertraline (ZOLOFT) 25 MG tablet Take 25 mg by mouth 2 (two) times daily.     No current facility-administered medications on file prior to visit.    No Known Allergies Family History  Problem Relation Age of Onset  . Hypertension Mother   . Depression Sister    Social History   Social History  . Marital status: Married    Spouse name: N/A  . Number of children: N/A  . Years of education: N/A   Social History Main Topics  . Smoking status: Never Smoker  . Smokeless tobacco: Never Used  . Alcohol use No  . Drug use: No  . Sexual activity: Yes    Birth control/ protection: Condom   Other Topics Concern  . None   Social History Narrative  . None   [facility] 03/06/2017 09/03/2016 09/14/2015 08/05/2014  Decreased Interest 0 0 0 1  Down,  Depressed, Hopeless 0 0 0 0  PHQ - 2 Score 0 0 0 1     Review of Systems  Constitutional: Positive for fatigue and unexpected weight change (gain). Negative for activity change, appetite change and fever.  Genitourinary: Positive for menstrual problem and pelvic pain. Negative for difficulty urinating, dysuria, enuresis, genital sores and vaginal discharge.  Musculoskeletal: Positive for back pain. Negative for gait problem.  Skin: Positive for rash and wound.  Allergic/Immunologic: Negative for immunocompromised state.  Neurological: Positive for headaches. Negative for seizures, syncope, facial asymmetry and speech difficulty.  Psychiatric/Behavioral: Positive for sleep disturbance.   See hpi    Objective:   Physical Exam  Constitutional: She is oriented to person, place, and time. She appears well-developed and well-nourished. No  distress.  Morbidly obese  HENT:  Head: Normocephalic and atraumatic.  Right Ear: External ear normal.  Left Ear: External ear normal.  Eyes: Conjunctivae are normal. No scleral icterus.  Neck: Normal range of motion. Neck supple. No thyromegaly present.  Cardiovascular: Normal rate, regular rhythm, normal heart sounds and intact distal pulses.   Pulmonary/Chest: Effort normal and breath sounds normal. No respiratory distress.  Musculoskeletal: She exhibits no edema.  Lymphadenopathy:    She has no cervical adenopathy.  Neurological: She is alert and oriented to person, place, and time.  Skin: Skin is warm and dry. Rash noted. Rash is not pustular and not urticarial. She is not diaphoretic. No erythema.  Flaking, scaling, and crusting scalp. Small approximately 1 cm round indurated areas with huney color crust around apex - de-roofed with with culture taken. No purulence. No warmth, tenderness, or fluctuance.  Psychiatric: She has a normal mood and affect. Her behavior is normal.      BP (!) 137/95   Pulse 76   Temp 98.3 F (36.8 C) (Oral)   Resp 18   Ht 5' 2.5" (1.588 m)   Wt 218 lb (98.9 kg)   SpO2 97%   BMI 39.24 kg/m      Assessment & Plan:  Okay to defer Pap to next year unless patient wants STD screen and will go ahead and complete this year.  1. Elevated blood pressure reading - Patient confident that this is an isolated elevation due to poor sleep last night. However, BP has been somewhat elevated over the past several years and as weight increases, I suspect she is developing hypertension as also positive family history. Patient easily to check BP outside of office for several readings. If elevated, would have low threshold to start either diuretic or consider acei or CCB as may help more with headache prevention.   2. Tinea capitis - Consider systemic antifungal treatment but will wait until LFTs and wound culture result as could be superficial bacterial versus fungal  infection.   3. Tinea pedis of both feet   4. Vitamin D deficiency - never took OTC after high-dose course was complete so restart high-dose 6 months.   5. Family planning - refilled OCPs. If menstrual migraines and mood disorder continues, may want to consider changing to IUD or Nexplanon. Patient has 1 daughter (mid-elementary age) and is divorced - no desire for any future fertility. If patient wants to know if she is perimenopausal, she will likely have to go off of her OCPs for 2-4 weeks before we check gonadotropins [facility]).   6. Premenstrual dysphoric disorder - see psych. On Zoloft and already doubles up during the premenstrual week.   7. Menstrual migraine without status migrainosus,  not intractable - newly worsening   8. Weight gain - worsening.  Patient considered bypass prior but was unable to afford out-of-pocket cost.   9. Medication monitoring encounter   10. Seborrhea capitis in adult -      Orders Placed This Encounter  Procedures  . WOUND CULTURE    Order Specific Question:   Source    Answer:   head  . CBC  . TSH  . Comprehensive metabolic panel  . Hemoglobin A1c  . Hemoglobin A1c  . Lipid panel    Standing Status:   Future    Standing Expiration Date:   03/09/2018    Order Specific Question:   Has the patient fasted?    Answer:   Yes  . POCT urinalysis dipstick    Meds ordered this encounter  Medications  . ergocalciferol (DRISDOL) 50000 units capsule    Sig: Take 1 capsule (50,000 Units total) by mouth once a week.    Dispense:  24 capsule    Refill:  0  . norethindrone-ethinyl estradiol-iron (MICROGESTIN FE 1.5/30) 1.5-30 MG-MCG tablet    Sig: Take 1 tablet by mouth daily.    Dispense:  84 tablet    Refill:  4  . albuterol (PROAIR HFA) 108 (90 Base) MCG/ACT inhaler    Sig: INHALE 2 PUFFS INTO THE LUNGS EVERY 4 HOURS AS NEEDED    Dispense:  8.5 g    Refill:  3    Hold on file until needed by pt     Delman Cheadle, M.D.  Primary Care at [facility] [address] [place], [place] [address] [phone] phone [phone] fax  03/16/17 8:21 AM

## 2017-03-06 ENCOUNTER — Ambulatory Visit (INDEPENDENT_AMBULATORY_CARE_PROVIDER_SITE_OTHER): Payer: BLUE CROSS/BLUE SHIELD | Admitting: Family Medicine

## 2017-03-06 ENCOUNTER — Encounter: Payer: Self-pay | Admitting: Family Medicine

## 2017-03-06 VITALS — BP 137/95 | HR 76 | Temp 98.3°F | Resp 18 | Ht 62.5 in | Wt 218.0 lb

## 2017-03-06 DIAGNOSIS — G43829 Menstrual migraine, not intractable, without status migrainosus: Secondary | ICD-10-CM

## 2017-03-06 DIAGNOSIS — R030 Elevated blood-pressure reading, without diagnosis of hypertension: Secondary | ICD-10-CM

## 2017-03-06 DIAGNOSIS — E559 Vitamin D deficiency, unspecified: Secondary | ICD-10-CM

## 2017-03-06 DIAGNOSIS — B353 Tinea pedis: Secondary | ICD-10-CM

## 2017-03-06 DIAGNOSIS — F3281 Premenstrual dysphoric disorder: Secondary | ICD-10-CM | POA: Diagnosis not present

## 2017-03-06 DIAGNOSIS — R635 Abnormal weight gain: Secondary | ICD-10-CM

## 2017-03-06 DIAGNOSIS — B350 Tinea barbae and tinea capitis: Secondary | ICD-10-CM | POA: Diagnosis not present

## 2017-03-06 DIAGNOSIS — L218 Other seborrheic dermatitis: Secondary | ICD-10-CM

## 2017-03-06 DIAGNOSIS — Z3009 Encounter for other general counseling and advice on contraception: Secondary | ICD-10-CM | POA: Diagnosis not present

## 2017-03-06 DIAGNOSIS — Z5181 Encounter for therapeutic drug level monitoring: Secondary | ICD-10-CM | POA: Diagnosis not present

## 2017-03-06 DIAGNOSIS — L0100 Impetigo, unspecified: Secondary | ICD-10-CM

## 2017-03-06 DIAGNOSIS — L210 Seborrhea capitis: Secondary | ICD-10-CM

## 2017-03-06 LAB — POCT URINALYSIS DIP (MANUAL ENTRY)
Bilirubin, UA: NEGATIVE
Glucose, UA: NEGATIVE mg/dL
Ketones, POC UA: NEGATIVE mg/dL
Leukocytes, UA: NEGATIVE
Nitrite, UA: NEGATIVE
RBC UA: NEGATIVE
SPEC GRAV UA: 1.015 (ref 1.010–1.025)
UROBILINOGEN UA: 0.2 U/dL
pH, UA: 7.5 (ref 5.0–8.0)

## 2017-03-06 MED ORDER — ALBUTEROL SULFATE HFA 108 (90 BASE) MCG/ACT IN AERS: INHALATION_SPRAY | RESPIRATORY_TRACT | 3 refills | Status: DC

## 2017-03-06 MED ORDER — ERGOCALCIFEROL 1.25 MG (50000 UT) PO CAPS: 50000.0000 [IU] | ORAL_CAPSULE | ORAL | 0 refills | Status: DC

## 2017-03-06 MED ORDER — NORETHIN ACE-ETH ESTRAD-FE 1.5-30 MG-MCG PO TABS: 1.0000 | ORAL_TABLET | Freq: Every day | ORAL | 4 refills | Status: DC

## 2017-03-06 NOTE — Patient Instructions (Addendum)
I will send in the antifungal as long as your liver tests come back ok. Should know by this weekend - I will send you a MyChart note.  Let me know what your blood pressure is running within the next 2 months with at least 3-4 readings outside the office.    IF you received an x-ray today, you will receive an invoice from [facility]. Please contact [facility] at [phone] with questions or concerns regarding your invoice.   IF you received labwork today, you will receive an invoice from [facility]. Please contact [facility] at [phone] with questions or concerns regarding your invoice.   Our billing staff will not be able to assist you with questions regarding bills from these companies.  You will be contacted with the lab results as soon as they are available. The fastest way to get your results is to activate your My Chart account. Instructions are located on the last page of this paperwork. If you have not heard from [place] regarding the results in 2 weeks, please contact this office.      Hypertension Hypertension, commonly called high blood pressure, is when the force of blood pumping through the arteries is too strong. The arteries are the blood vessels that carry blood from the heart throughout the body. Hypertension forces the heart to work harder to pump blood and may cause arteries to become narrow or stiff. Having untreated or uncontrolled hypertension can cause heart attacks, strokes, kidney disease, and other problems. A blood pressure reading consists of a higher number over a lower number. Ideally, your blood pressure should be below 120/80. The first ("top") number is called the systolic pressure. It is a measure of the pressure in your arteries as your heart beats. The second ("bottom") number is called the diastolic pressure. It is a measure of the pressure in your arteries as the heart relaxes. What are the causes? The cause of this condition is not  known. What increases the risk? Some risk factors for high blood pressure are under your control. Others are not. Factors you can change  Smoking.  Having type 2 diabetes mellitus, high cholesterol, or both.  Not getting enough exercise or physical activity.  Being overweight.  Having too much fat, sugar, calories, or salt (sodium) in your diet.  Drinking too much alcohol. Factors that are difficult or impossible to change  Having chronic kidney disease.  Having a family history of high blood pressure.  Age. Risk increases with age.  Race. You may be at higher risk if you are African-American.  Gender. Men are at higher risk than women before age 47. After age 80, women are at higher risk than men.  Having obstructive sleep apnea.  Stress. What are the signs or symptoms? Extremely high blood pressure (hypertensive crisis) may cause:  Headache.  Anxiety.  Shortness of breath.  Nosebleed.  Nausea and vomiting.  Severe chest pain.  Jerky movements you cannot control (seizures).  How is this diagnosed? This condition is diagnosed by measuring your blood pressure while you are seated, with your arm resting on a surface. The cuff of the blood pressure monitor will be placed directly against the skin of your upper arm at the level of your heart. It should be measured at least twice using the same arm. Certain conditions can cause a difference in blood pressure between your right and left arms. Certain factors can cause blood pressure readings to be lower or higher than normal (elevated) for a short  period of time:  When your blood pressure is higher when you are in a health care provider's office than when you are at home, this is called white coat hypertension. Most people with this condition do not need medicines.  When your blood pressure is higher at home than when you are in a health care provider's office, this is called masked hypertension. Most people with this  condition may need medicines to control blood pressure.  If you have a high blood pressure reading during one visit or you have normal blood pressure with other risk factors:  You may be asked to return on a different day to have your blood pressure checked again.  You may be asked to monitor your blood pressure at home for 1 week or longer.  If you are diagnosed with hypertension, you may have other blood or imaging tests to help your health care provider understand your overall risk for other conditions. How is this treated? This condition is treated by making healthy lifestyle changes, such as eating healthy foods, exercising more, and reducing your alcohol intake. Your health care provider may prescribe medicine if lifestyle changes are not enough to get your blood pressure under control, and if:  Your systolic blood pressure is above 130.  Your diastolic blood pressure is above 80.  Your personal target blood pressure may vary depending on your medical conditions, your age, and other factors. Follow these instructions at home: Eating and drinking  Eat a diet that is high in fiber and potassium, and low in sodium, added sugar, and fat. An example eating plan is called the DASH (Dietary Approaches to Stop Hypertension) diet. To eat this way: ? Eat plenty of fresh fruits and vegetables. Try to fill half of your plate at each meal with fruits and vegetables. ? Eat whole grains, such as whole wheat pasta, brown rice, or whole grain bread. Fill about one quarter of your plate with whole grains. ? Eat or drink low-fat dairy products, such as skim milk or low-fat yogurt. ? Avoid fatty cuts of meat, processed or cured meats, and poultry with skin. Fill about one quarter of your plate with lean proteins, such as fish, chicken without skin, beans, eggs, and tofu. ? Avoid premade and processed foods. These tend to be higher in sodium, added sugar, and fat.  Reduce your daily sodium intake. Most  people with hypertension should eat less than 1,500 mg of sodium a day.  Limit alcohol intake to no more than 1 drink a day for nonpregnant women and 2 drinks a day for men. One drink equals 12 oz of beer, 5 oz of wine, or 1 oz of hard liquor. Lifestyle  Work with your health care provider to maintain a healthy body weight or to lose weight. Ask what an ideal weight is for you.  Get at least 30 minutes of exercise that causes your heart to beat faster (aerobic exercise) most days of the week. Activities may include walking, swimming, or biking.  Include exercise to strengthen your muscles (resistance exercise), such as pilates or lifting weights, as part of your weekly exercise routine. Try to do these types of exercises for 30 minutes at least 3 days a week.  Do not use any products that contain nicotine or tobacco, such as cigarettes and e-cigarettes. If you need help quitting, ask your health care provider.  Monitor your blood pressure at home as told by your health care provider.  Keep all follow-up visits as told  by your health care provider. This is important. Medicines  Take over-the-counter and prescription medicines only as told by your health care provider. Follow directions carefully. Blood pressure medicines must be taken as prescribed.  Do not skip doses of blood pressure medicine. Doing this puts you at risk for problems and can make the medicine less effective.  Ask your health care provider about side effects or reactions to medicines that you should watch for. Contact a health care provider if:  You think you are having a reaction to a medicine you are taking.  You have headaches that keep coming back (recurring).  You feel dizzy.  You have swelling in your ankles.  You have trouble with your vision. Get help right away if:  You develop a severe headache or confusion.  You have unusual weakness or numbness.  You feel faint.  You have severe pain in your  chest or abdomen.  You vomit repeatedly.  You have trouble breathing. Summary  Hypertension is when the force of blood pumping through your arteries is too strong. If this condition is not controlled, it may put you at risk for serious complications.  Your personal target blood pressure may vary depending on your medical conditions, your age, and other factors. For most people, a normal blood pressure is less than 120/80.  Hypertension is treated with lifestyle changes, medicines, or a combination of both. Lifestyle changes include weight loss, eating a healthy, low-sodium diet, exercising more, and limiting alcohol. This information is not intended to replace advice given to you by your health care provider. Make sure you discuss any questions you have with your health care provider. Document Released: 08/12/2005 Document Revised: 07/10/2016 Document Reviewed: 07/10/2016 Elsevier Interactive Patient Education  2018 [place].  Seborrheic Dermatitis, Adult Seborrheic dermatitis is a skin disease that causes red, scaly patches. It usually occurs on the scalp, and it is often called dandruff. The patches may appear on other parts of the body. Skin patches tend to appear where there are many oil glands in the skin. Areas of the body that are commonly affected include:  Scalp.  Skin folds of the body.  Ears.  Eyebrows.  Neck.  Face.  Armpits.  The bearded area of men's faces.  The condition may come and go for no known reason, and it is often long-lasting (chronic). What are the causes? The cause of this condition is not known. What increases the risk? This condition is more likely to develop in people who:  Have certain conditions, such as: ? HIV (human immunodeficiency virus). ? AIDS (acquired immunodeficiency syndrome). ? Parkinson disease. ? Mood disorders, such as depression.  Are 56-52 years old.  What are the signs or symptoms? Symptoms of this condition  include:  Thick scales on the scalp.  Redness on the face or in the armpits.  Skin that is flaky. The flakes may be white or yellow.  Skin that seems oily or dry but is not helped with moisturizers.  Itching or burning in the affected areas.  How is this diagnosed? This condition is diagnosed with a medical history and physical exam. A sample of your skin may be tested (skin biopsy). You may need to see a skin specialist (dermatologist). How is this treated? There is no cure for this condition, but treatment can help to manage the symptoms. You may get treatment to remove scales, lower the risk of skin infection, and reduce swelling or itching. Treatment may include:  Creams that reduce swelling  and irritation (steroids).  Creams that reduce skin yeast.  Medicated shampoo, soaps, moisturizing creams, or ointments.  Medicated moisturizing creams or ointments.  Follow these instructions at home:  Apply over-the-counter and prescription medicines only as told by your health care provider.  Use any medicated shampoo, soaps, skin creams, or ointments only as told by your health care provider.  Keep all follow-up visits as told by your health care provider. This is important. Contact a health care provider if:  Your symptoms do not improve with treatment.  Your symptoms get worse.  You have new symptoms. This information is not intended to replace advice given to you by your health care provider. Make sure you discuss any questions you have with your health care provider. Document Released: 08/12/2005 Document Revised: 03/01/2016 Document Reviewed: 11/30/2015 Elsevier Interactive Patient Education  [employer].

## 2017-03-07 LAB — COMPREHENSIVE METABOLIC PANEL
ALBUMIN: 4.2 g/dL (ref 3.5–5.5)
ALK PHOS: 49 IU/L (ref 39–117)
ALT: 19 IU/L (ref 0–32)
AST: 15 IU/L (ref 0–40)
Albumin/Globulin Ratio: 1.7 (ref 1.2–2.2)
BUN / CREAT RATIO: 19 (ref 9–23)
BUN: 14 mg/dL (ref 6–24)
CHLORIDE: 100 mmol/L (ref 96–106)
CO2: 22 mmol/L (ref 20–29)
Calcium: 9.6 mg/dL (ref 8.7–10.2)
Creatinine, Ser: 0.73 mg/dL (ref 0.57–1.00)
GFR calc Af Amer: 116 mL/min/{1.73_m2} (ref >59)
GFR calc non Af Amer: 100 mL/min/{1.73_m2} (ref >59)
GLOBULIN, TOTAL: 2.5 g/dL (ref 1.5–4.5)
Glucose: 84 mg/dL (ref 65–99)
POTASSIUM: 4.7 mmol/L (ref 3.5–5.2)
Sodium: 139 mmol/L (ref 134–144)
Total Protein: 6.7 g/dL (ref 6.0–8.5)

## 2017-03-07 LAB — CBC
HEMATOCRIT: 40.4 % (ref 34.0–46.6)
Hemoglobin: 13.7 g/dL (ref 11.1–15.9)
MCH: 30.9 pg (ref 26.6–33.0)
MCHC: 33.9 g/dL (ref 31.5–35.7)
MCV: 91 fL (ref 79–97)
PLATELETS: 278 10*3/uL (ref 150–379)
RBC: 4.44 x10E6/uL (ref 3.77–5.28)
RDW: 13.6 % (ref 12.3–15.4)
WBC: 6.7 10*3/uL (ref 3.4–10.8)

## 2017-03-07 LAB — HEMOGLOBIN A1C
Est. average glucose Bld gHb Est-mCnc: 94 mg/dL
HEMOGLOBIN A1C: 4.9 % (ref 4.8–5.6)

## 2017-03-07 LAB — TSH: TSH: 4.03 u[IU]/mL (ref 0.450–4.500)

## 2017-03-09 LAB — WOUND CULTURE: ORGANISM ID, BACTERIA: NONE SEEN

## 2017-03-14 ENCOUNTER — Encounter: Payer: Self-pay | Admitting: Family Medicine

## 2017-03-16 ENCOUNTER — Encounter: Payer: Self-pay | Admitting: Family Medicine

## 2017-03-16 MED ORDER — KETOCONAZOLE 2 % EX SHAM: 1.0000 "application " | MEDICATED_SHAMPOO | Freq: Every day | CUTANEOUS | 1 refills | Status: DC

## 2017-03-16 MED ORDER — DICLOXACILLIN SODIUM 500 MG PO CAPS: 500.0000 mg | ORAL_CAPSULE | Freq: Four times a day (QID) | ORAL | 0 refills | Status: DC

## 2017-03-16 NOTE — Addendum Note (Signed)
Addended by: Shawnee Knapp on: 03/16/2017 09:29 AM   Modules accepted: Orders

## 2017-06-05 ENCOUNTER — Ambulatory Visit: Payer: BLUE CROSS/BLUE SHIELD | Admitting: Family Medicine

## 2017-06-17 ENCOUNTER — Encounter: Payer: Self-pay | Admitting: Family Medicine

## 2017-06-21 ENCOUNTER — Ambulatory Visit (INDEPENDENT_AMBULATORY_CARE_PROVIDER_SITE_OTHER): Payer: Self-pay | Admitting: Family Medicine

## 2017-06-21 DIAGNOSIS — R635 Abnormal weight gain: Secondary | ICD-10-CM

## 2017-06-21 DIAGNOSIS — G43829 Menstrual migraine, not intractable, without status migrainosus: Secondary | ICD-10-CM

## 2017-06-21 DIAGNOSIS — R030 Elevated blood-pressure reading, without diagnosis of hypertension: Secondary | ICD-10-CM

## 2017-06-21 DIAGNOSIS — F3281 Premenstrual dysphoric disorder: Secondary | ICD-10-CM

## 2017-06-22 ENCOUNTER — Encounter: Payer: Self-pay | Admitting: Family Medicine

## 2017-06-22 LAB — THYROID PANEL WITH TSH
Free Thyroxine Index: 1.8 (ref 1.2–4.9)
T3 UPTAKE RATIO: 22 % — AB (ref 24–39)
T4, Total: 8 ug/dL (ref 4.5–12.0)
TSH: 7.73 u[IU]/mL — ABNORMAL HIGH (ref 0.450–4.500)

## 2017-06-22 LAB — LIPID PANEL
CHOL/HDL RATIO: 4 ratio (ref 0.0–4.4)
Cholesterol, Total: 292 mg/dL — ABNORMAL HIGH (ref 100–199)
HDL: 73 mg/dL (ref >39)
LDL Calculated: 199 mg/dL — ABNORMAL HIGH (ref 0–99)
TRIGLYCERIDES: 99 mg/dL (ref 0–149)
VLDL Cholesterol Cal: 20 mg/dL (ref 5–40)

## 2017-06-22 MED ORDER — LEVOTHYROXINE SODIUM 50 MCG PO TABS: 50.0000 ug | ORAL_TABLET | Freq: Every day | ORAL | 0 refills | Status: DC

## 2017-06-22 NOTE — Progress Notes (Signed)
Was lab only visit

## 2017-06-23 ENCOUNTER — Encounter: Payer: Self-pay | Admitting: Family Medicine

## 2017-08-26 ENCOUNTER — Encounter: Payer: Self-pay | Admitting: Family Medicine

## 2017-08-27 ENCOUNTER — Encounter: Payer: Self-pay | Admitting: Family Medicine

## 2017-09-23 ENCOUNTER — Encounter: Payer: Self-pay | Admitting: Family Medicine

## 2017-09-23 ENCOUNTER — Ambulatory Visit (INDEPENDENT_AMBULATORY_CARE_PROVIDER_SITE_OTHER): Payer: BLUE CROSS/BLUE SHIELD | Admitting: Family Medicine

## 2017-09-23 VITALS — BP 140/91 | HR 70 | Temp 98.5°F | Resp 16 | Ht 62.5 in | Wt 206.0 lb

## 2017-09-23 DIAGNOSIS — R635 Abnormal weight gain: Secondary | ICD-10-CM | POA: Diagnosis not present

## 2017-09-23 DIAGNOSIS — E039 Hypothyroidism, unspecified: Secondary | ICD-10-CM | POA: Diagnosis not present

## 2017-09-23 DIAGNOSIS — E559 Vitamin D deficiency, unspecified: Secondary | ICD-10-CM | POA: Diagnosis not present

## 2017-09-23 DIAGNOSIS — N912 Amenorrhea, unspecified: Secondary | ICD-10-CM | POA: Diagnosis not present

## 2017-09-23 DIAGNOSIS — E6609 Other obesity due to excess calories: Secondary | ICD-10-CM

## 2017-09-23 DIAGNOSIS — Z23 Encounter for immunization: Secondary | ICD-10-CM

## 2017-09-23 DIAGNOSIS — Z6837 Body mass index (BMI) 37.0-37.9, adult: Secondary | ICD-10-CM | POA: Diagnosis not present

## 2017-09-23 DIAGNOSIS — R5382 Chronic fatigue, unspecified: Secondary | ICD-10-CM

## 2017-09-23 DIAGNOSIS — F3281 Premenstrual dysphoric disorder: Secondary | ICD-10-CM

## 2017-09-23 MED ORDER — LAMOTRIGINE 100 MG PO TABS: ORAL_TABLET | ORAL | 0 refills | Status: DC

## 2017-09-23 MED ORDER — CLONAZEPAM 1 MG PO TABS: 1.0000 mg | ORAL_TABLET | Freq: Two times a day (BID) | ORAL | 1 refills | Status: DC | PRN

## 2017-09-23 MED ORDER — SERTRALINE HCL 25 MG PO TABS: 25.0000 mg | ORAL_TABLET | Freq: Two times a day (BID) | ORAL | 1 refills | Status: DC

## 2017-09-23 MED ORDER — LAMOTRIGINE 100 MG PO TABS: 250.0000 mg | ORAL_TABLET | Freq: Every day | ORAL | 2 refills | Status: DC

## 2017-09-23 MED ORDER — LAMOTRIGINE 25 MG PO TABS: ORAL_TABLET | ORAL | 0 refills | Status: DC

## 2017-09-23 NOTE — Progress Notes (Signed)
Subjective:  By signing my name below, I, [name], attest that this documentation has been prepared under the direction and in the presence of Delman Cheadle, MD Electronically Signed: Ladene Artist, ED Scribe 09/23/2017 at 2:33 PM.   Patient ID: Selena Chapman, female    DOB: December 22, 1970, 47 y.o.   MRN: [id number]  Chief Complaint  Patient presents with  . Thyroid Problem  . Hypertension  . Medication Refill    sertraline, lamictal  . HPI Selena Chapman is a 47 y.o. female who presents to Primary Care at [facility] for f/u on thyroid. 3 months ago,TSH increased to 7.73 and she was noting hypothyroidism symptoms of fatigue and weight gain. She was offered to start levothyroxine 50 but was wanting to do additional blood tests to look for other causes.  Pt has noticed that dry skin, dry hair, cold intolerance, tenderness around her thyroid and fatigue improved within 3 wks of taking levothyroxine. Does not recall any stressful events that occurred around the time of her last OV. She is on BC pills so she does not have many periods. She is currently taking 5,000 IU Vitamin D every other day.  Pt changed her diet in Aug which now consists of eggs, lean meats and more vegetables. Also walks and lifts weights at the gym. Wt Readings from Last 3 Encounters:  09/23/17 206 lb (93.4 kg)  03/06/17 218 lb (98.9 kg)  09/03/16 209 lb 3.2 oz (94.9 kg)   Anxiety/Depression  Pt requests a refill of Zoloft 25 mg bid and Lamictal 250 mg qd. Pt ran out in Sept. Also requests a refill of Klonopin; states she used to take this for panic attacks which she is starting to have again. She sees another psychiatrist in April.  Past Medical History:  Diagnosis Date  . Allergy   . Asthma   . Depression    Current Outpatient Medications on File Prior to Visit  Medication Sig Dispense Refill  . albuterol (PROAIR HFA) 108 (90 Base) MCG/ACT inhaler INHALE 2 PUFFS INTO THE LUNGS EVERY 4 HOURS AS NEEDED  8.5 g 3  . levothyroxine (SYNTHROID, LEVOTHROID) 50 MCG tablet Take 1 tablet (50 mcg total) by mouth daily before breakfast. 90 tablet 0  . norethindrone-ethinyl estradiol-iron (MICROGESTIN FE 1.5/30) 1.5-30 MG-MCG tablet Take 1 tablet by mouth daily. 84 tablet 4  . ergocalciferol (DRISDOL) 50000 units capsule Take 1 capsule (50,000 Units total) by mouth once a week. (Patient not taking: Reported on 09/23/2017) 24 capsule 0  . lamoTRIgine (LAMICTAL) 100 MG tablet TK 2 AND 1/2 TS PO QD  0  . sertraline (ZOLOFT) 25 MG tablet Take 25 mg by mouth 2 (two) times daily.     No current facility-administered medications on file prior to visit.    No Known Allergies   Past Surgical History:  Procedure Laterality Date  . APPENDECTOMY     Family History  Problem Relation Age of Onset  . Hypertension Mother   . Depression Sister    Social History   Socioeconomic History  . Marital status: Divorced    Spouse name: [name]  . Number of children: 1  . Years of education: None  . Highest education level: None  Social Needs  . Financial resource strain: None  . Food insecurity - worry: None  . Food insecurity - inability: None  . Transportation needs - medical: None  . Transportation needs - non-medical: None  Occupational History  . None  Tobacco Use  .  Smoking status: Never Smoker  . Smokeless tobacco: Never Used  Substance and Sexual Activity  . Alcohol use: No  . Drug use: No  . Sexual activity: Not Currently    Birth control/protection: Condom, Pill, Abstinence  Other Topics Concern  . None  Social History Narrative  . None   Depression screen [facility] 2/9 03/06/2017 09/03/2016 09/14/2015 08/05/2014  Decreased Interest 0 0 0 1  Down, Depressed, Hopeless 0 0 0 0  PHQ - 2 Score 0 0 0 1    Review of Systems  Constitutional: Positive for activity change, appetite change and chills. Negative for fatigue (improved), fever and unexpected weight change.  Respiratory: Negative for chest  tightness.   Cardiovascular: Negative for chest pain and palpitations.  Gastrointestinal: Negative for constipation and diarrhea.  Endocrine: Negative for cold intolerance (improved) and heat intolerance.  Skin: Negative for rash.  Allergic/Immunologic: Negative for immunocompromised state.  Neurological: Negative for weakness and numbness.  Psychiatric/Behavioral: Positive for dysphoric mood. The patient is nervous/anxious.       Objective:   Physical Exam  Constitutional: She is oriented to person, place, and time. She appears well-developed and well-nourished. No distress.  HENT:  Head: Normocephalic and atraumatic.  Right Ear: Tympanic membrane normal.  Left Ear: Tympanic membrane normal.  Nose: Nose normal.  Mouth/Throat: Oropharynx is clear and moist and mucous membranes are normal.  Eyes: Conjunctivae and EOM are normal.  Neck: Neck supple. No tracheal deviation present. No thyromegaly present.  Cardiovascular: Normal rate, regular rhythm and normal heart sounds.  Pulmonary/Chest: Effort normal and breath sounds normal. No respiratory distress.  Musculoskeletal: Normal range of motion.  Neurological: She is alert and oriented to person, place, and time.  Skin: Skin is warm and dry.  Psychiatric: She has a normal mood and affect. Her behavior is normal.  Nursing note and vitals reviewed.  BP (!) 140/91   Pulse 70   Temp 98.5 F (36.9 C) (Oral)   Resp 16   Ht 5' 2.5" (1.588 m)   Wt 206 lb (93.4 kg)   SpO2 98%   BMI 37.08 kg/m      Assessment & Plan:   1.  Acquired hypothyroidism - subclinical with TSH up to 7._ but pt was symptomatic with cold intolerance, fatigue, depression, dry skin and hair and many of the sxs have improved since starting levothyroxine 50 other than cold intolerance and low metabolism. TSH nml today so ok to cont levothyroxine 50. Recheck in 6 mos before further refills.  2. Vitamin D deficiency - resolved, cont otc 5000u qod  3. Needs flu shot    4. Chronic fatigue   5. Weight gain   6. Class 2 obesity due to excess calories without serious comorbidity with body mass index (BMI) of 37.0 to 37.9 in adult - working out with aerobic and weights, very low caloried diet of 1225m for > 6 mos with really slow weight loss - 10-15 lbs, no improvement since starting levothyroxine. RTC to discuss weight loss meds options/other support once back stable on psych meds.  7. Amenorrhea - pt thinks she is starting menopause - check labs  8.      PMDD/depression - was doing really well on lamictal 2058mqd and sertraline 25 bid but lost psychiatrist and appt to establish with new psych not for another month - has been off of lamictal and sertraline for ~6 wks so will restart but needs to wean back up, then will transer rxs back to psych  Orders Placed This Encounter  Procedures  . Flu Vaccine QUAD 36+ mos IM  . VITAMIN D 25 Hydroxy (Vit-D Deficiency, Fractures)  . TSH+T4F+T3Free  . Vitamin B12  . Comprehensive metabolic panel  . CBC with Differential/Platelet  . FSH/LH  . Sedimentation Rate    Meds ordered this encounter  Medications  . lamoTRIgine (LAMICTAL) 25 MG tablet    Sig: Take 1 tablet (25 mg total) by mouth daily for 14 days, THEN 2 tablets (50 mg total) daily for 14 days.    Dispense:  42 tablet    Refill:  0  . sertraline (ZOLOFT) 25 MG tablet    Sig: Take 1 tablet (25 mg total) by mouth 2 (two) times daily.    Dispense:  180 tablet    Refill:  1  . lamoTRIgine (LAMICTAL) 100 MG tablet    Sig: Take 1 tablet (100 mg total) by mouth daily for 14 days, THEN 2 tablets (200 mg total) daily for 14 days.    Dispense:  42 tablet    Refill:  0  . lamoTRIgine (LAMICTAL) 100 MG tablet    Sig: Take 2.5 tablets (250 mg total) by mouth daily.    Dispense:  75 tablet    Refill:  2  . clonazePAM (KLONOPIN) 1 MG tablet    Sig: Take 1 tablet (1 mg total) by mouth 2 (two) times daily as needed for anxiety.    Dispense:  20 tablet    Refill:   1    I personally performed the services described in this documentation, which was scribed in my presence. The recorded information has been reviewed and considered, and addended by me as needed.   Delman Cheadle, M.D.  Primary Care at [facility] [address] [place], [place] [address] [phone] phone [phone] fax  09/24/17 11:13 PM

## 2017-09-23 NOTE — Patient Instructions (Addendum)
IF you received an x-ray today, you will receive an invoice from [facility]. Please contact [facility] at [phone] with questions or concerns regarding your invoice.   IF you received labwork today, you will receive an invoice from [place]. Please contact [facility] at [phone] with questions or concerns regarding your invoice.   Our billing staff will not be able to assist you with questions regarding bills from these companies.  You will be contacted with the lab results as soon as they are available. The fastest way to get your results is to activate your My Chart account. Instructions are located on the last page of this paperwork. If you have not heard from [place] regarding the results in 2 weeks, please contact this office.     Exercising to Lose Weight Exercising can help you to lose weight. In order to lose weight through exercise, you need to do vigorous-intensity exercise. You can tell that you are exercising with vigorous intensity if you are breathing very hard and fast and cannot hold a conversation while exercising. Moderate-intensity exercise helps to maintain your current weight. You can tell that you are exercising at a moderate level if you have a higher heart rate and faster breathing, but you are still able to hold a conversation. How often should I exercise? Choose an activity that you enjoy and set realistic goals. Your health care provider can help you to make an activity plan that works for you. Exercise regularly as directed by your health care provider. This may include:  Doing resistance training twice each week, such as: ? Push-ups. ? Sit-ups. ? Lifting weights. ? Using resistance bands.  Doing a given intensity of exercise for a given amount of time. Choose from these options: ? 150 minutes of moderate-intensity exercise every week. ? 75 minutes of vigorous-intensity exercise every week. ? A mix of moderate-intensity and  vigorous-intensity exercise every week.  Children, pregnant women, people who are out of shape, people who are overweight, and older adults may need to consult a health care provider for individual recommendations. If you have any sort of medical condition, be sure to consult your health care provider before starting a new exercise program. What are some activities that can help me to lose weight?  Walking at a rate of at least 4.5 miles an hour.  Jogging or running at a rate of 5 miles per hour.  Biking at a rate of at least 10 miles per hour.  Lap swimming.  Roller-skating or in-line skating.  Cross-country skiing.  Vigorous competitive sports, such as football, basketball, and soccer.  Jumping rope.  Aerobic dancing. How can I be more active in my day-to-day activities?  Use the stairs instead of the elevator.  Take a walk during your lunch break.  If you drive, park your car farther away from work or school.  If you take public transportation, get off one stop early and walk the rest of the way.  Make all of your phone calls while standing up and walking around.  Get up, stretch, and walk around every 30 minutes throughout the day. What guidelines should I follow while exercising?  Do not exercise so much that you hurt yourself, feel dizzy, or get very short of breath.  Consult your health care provider prior to starting a new exercise program.  Wear comfortable clothes and shoes with good support.  Drink plenty of water while you exercise to prevent dehydration or heat stroke. Body water is  lost during exercise and must be replaced.  Work out until you breathe faster and your heart beats faster. This information is not intended to replace advice given to you by your health care provider. Make sure you discuss any questions you have with your health care provider. Document Released: 09/14/2010 Document Revised: 01/18/2016 Document Reviewed: 01/13/2014 Elsevier  Interactive Patient Education  [employer].

## 2017-09-24 LAB — CBC WITH DIFFERENTIAL/PLATELET
BASOS ABS: 0 10*3/uL (ref 0.0–0.2)
BASOS: 1 %
EOS (ABSOLUTE): 0.3 10*3/uL (ref 0.0–0.4)
Eos: 4 %
Hematocrit: 39.6 % (ref 34.0–46.6)
Hemoglobin: 13.6 g/dL (ref 11.1–15.9)
IMMATURE GRANS (ABS): 0 10*3/uL (ref 0.0–0.1)
Immature Granulocytes: 0 %
Lymphocytes Absolute: 1.7 10*3/uL (ref 0.7–3.1)
Lymphs: 30 %
MCH: 31.2 pg (ref 26.6–33.0)
MCHC: 34.3 g/dL (ref 31.5–35.7)
MCV: 91 fL (ref 79–97)
MONOS ABS: 0.4 10*3/uL (ref 0.1–0.9)
Monocytes: 7 %
NEUTROS ABS: 3.3 10*3/uL (ref 1.4–7.0)
Neutrophils: 58 %
PLATELETS: 249 10*3/uL (ref 150–379)
RBC: 4.36 x10E6/uL (ref 3.77–5.28)
RDW: 13.1 % (ref 12.3–15.4)
WBC: 5.7 10*3/uL (ref 3.4–10.8)

## 2017-09-24 LAB — COMPREHENSIVE METABOLIC PANEL
A/G RATIO: 1.4 (ref 1.2–2.2)
ALT: 28 IU/L (ref 0–32)
AST: 22 IU/L (ref 0–40)
Albumin: 3.9 g/dL (ref 3.5–5.5)
Alkaline Phosphatase: 38 IU/L — ABNORMAL LOW (ref 39–117)
BUN/Creatinine Ratio: 15 (ref 9–23)
BUN: 10 mg/dL (ref 6–24)
CHLORIDE: 100 mmol/L (ref 96–106)
CO2: 23 mmol/L (ref 20–29)
Calcium: 9.6 mg/dL (ref 8.7–10.2)
Creatinine, Ser: 0.67 mg/dL (ref 0.57–1.00)
GFR calc non Af Amer: 107 mL/min/{1.73_m2} (ref >59)
GFR, EST AFRICAN AMERICAN: 124 mL/min/{1.73_m2} (ref >59)
GLOBULIN, TOTAL: 2.7 g/dL (ref 1.5–4.5)
Glucose: 81 mg/dL (ref 65–99)
POTASSIUM: 4.3 mmol/L (ref 3.5–5.2)
SODIUM: 138 mmol/L (ref 134–144)
Total Protein: 6.6 g/dL (ref 6.0–8.5)

## 2017-09-24 LAB — SEDIMENTATION RATE: Sed Rate: 6 mm/hr (ref 0–32)

## 2017-09-24 LAB — VITAMIN D 25 HYDROXY (VIT D DEFICIENCY, FRACTURES): VIT D 25 HYDROXY: 53.5 ng/mL (ref 30.0–100.0)

## 2017-09-24 LAB — FSH/LH
FSH: 6.5 m[IU]/mL
LH: 5.2 m[IU]/mL

## 2017-09-24 LAB — TSH+T4F+T3FREE
FREE T4: 1.25 ng/dL (ref 0.82–1.77)
T3, Free: 2.4 pg/mL (ref 2.0–4.4)
TSH: 2.03 u[IU]/mL (ref 0.450–4.500)

## 2017-09-24 LAB — VITAMIN B12: VITAMIN B 12: 545 pg/mL (ref 232–1245)

## 2017-09-24 MED ORDER — VITAMIN D3 125 MCG (5000 UT) PO CAPS: 1.0000 | ORAL_CAPSULE | ORAL | Status: DC

## 2017-09-24 MED ORDER — LEVOTHYROXINE SODIUM 50 MCG PO TABS: 50.0000 ug | ORAL_TABLET | Freq: Every day | ORAL | 1 refills | Status: DC

## 2017-10-10 ENCOUNTER — Ambulatory Visit (INDEPENDENT_AMBULATORY_CARE_PROVIDER_SITE_OTHER): Payer: BLUE CROSS/BLUE SHIELD | Admitting: Emergency Medicine

## 2017-10-10 ENCOUNTER — Encounter: Payer: Self-pay | Admitting: Emergency Medicine

## 2017-10-10 ENCOUNTER — Other Ambulatory Visit: Payer: Self-pay

## 2017-10-10 VITALS — BP 126/85 | HR 84 | Temp 98.9°F | Resp 16 | Ht 62.0 in | Wt 201.8 lb

## 2017-10-10 DIAGNOSIS — R05 Cough: Secondary | ICD-10-CM

## 2017-10-10 DIAGNOSIS — R059 Cough, unspecified: Secondary | ICD-10-CM | POA: Insufficient documentation

## 2017-10-10 DIAGNOSIS — Z8709 Personal history of other diseases of the respiratory system: Secondary | ICD-10-CM

## 2017-10-10 DIAGNOSIS — J22 Unspecified acute lower respiratory infection: Secondary | ICD-10-CM | POA: Diagnosis not present

## 2017-10-10 MED ORDER — AZITHROMYCIN 250 MG PO TABS: ORAL_TABLET | ORAL | 0 refills | Status: DC

## 2017-10-10 MED ORDER — PREDNISONE 20 MG PO TABS: 20.0000 mg | ORAL_TABLET | Freq: Every day | ORAL | 0 refills | Status: AC

## 2017-10-10 NOTE — Patient Instructions (Addendum)
     IF you received an x-ray today, you will receive an invoice from [facility]. Please contact [facility] at [phone] with questions or concerns regarding your invoice.   IF you received labwork today, you will receive an invoice from [place]. Please contact [facility] at [phone] with questions or concerns regarding your invoice.   Our billing staff will not be able to assist you with questions regarding bills from these companies.  You will be contacted with the lab results as soon as they are available. The fastest way to get your results is to activate your My Chart account. Instructions are located on the last page of this paperwork. If you have not heard from [place] regarding the results in 2 weeks, please contact this office.     Acute Bronchitis, Adult Acute bronchitis is when air tubes (bronchi) in the lungs suddenly get swollen. The condition can make it hard to breathe. It can also cause these symptoms:  A cough.  Coughing up clear, yellow, or green mucus.  Wheezing.  Chest congestion.  Shortness of breath.  A fever.  Body aches.  Chills.  A sore throat.  Follow these instructions at home: Medicines  Take over-the-counter and prescription medicines only as told by your doctor.  If you were prescribed an antibiotic medicine, take it as told by your doctor. Do not stop taking the antibiotic even if you start to feel better. General instructions  Rest.  Drink enough fluids to keep your pee (urine) clear or pale yellow.  Avoid smoking and secondhand smoke. If you smoke and you need help quitting, ask your doctor. Quitting will help your lungs heal faster.  Use an inhaler, cool mist vaporizer, or humidifier as told by your doctor.  Keep all follow-up visits as told by your doctor. This is important. How is this prevented? To lower your risk of getting this condition again:  Wash your hands often with soap and water. If you cannot  use soap and water, use hand sanitizer.  Avoid contact with people who have cold symptoms.  Try not to touch your hands to your mouth, nose, or eyes.  Make sure to get the flu shot every year.  Contact a doctor if:  Your symptoms do not get better in 2 weeks. Get help right away if:  You cough up blood.  You have chest pain.  You have very bad shortness of breath.  You become dehydrated.  You faint (pass out) or keep feeling like you are going to pass out.  You keep throwing up (vomiting).  You have a very bad headache.  Your fever or chills gets worse. This information is not intended to replace advice given to you by your health care provider. Make sure you discuss any questions you have with your health care provider. Document Released: 01/29/2008 Document Revised: 03/20/2016 Document Reviewed: 01/31/2016 Elsevier Interactive Patient Education  [employer].

## 2017-10-10 NOTE — Progress Notes (Signed)
Selena Chapman 47 y.o.   Chief Complaint  Patient presents with  . Cough    "clear to pinkish yellow" x 1 wk  . shortness of breath    HISTORY OF PRESENT ILLNESS: This is a 47 y.o. female complaining of productive cough and chest congestion for 1 week.  Boyfriend also sick.  .Cough  This is a new problem. The current episode started in the past 7 days. The problem has been gradually worsening. The problem occurs every few minutes. The cough is productive of sputum. Associated symptoms include chills, a fever, nasal congestion, shortness of breath and wheezing. Pertinent negatives include no chest pain, ear pain, eye redness, headaches, heartburn, hemoptysis, myalgias, rash, sore throat or weight loss. She has tried OTC cough suppressant and a beta-agonist inhaler for the symptoms. The treatment provided mild relief. Her past medical history is significant for asthma and bronchitis.     Prior to Admission medications   Medication Sig Start Date End Date Taking? Authorizing Provider  albuterol (PROAIR HFA) 108 (90 Base) MCG/ACT inhaler INHALE 2 PUFFS INTO THE LUNGS EVERY 4 HOURS AS NEEDED 03/06/17  Yes Shawnee Knapp, MD  Cholecalciferol (VITAMIN D) 2000 units CAPS Take by mouth.   Yes [provider]  clonazePAM (KLONOPIN) 1 MG tablet Take 1 tablet (1 mg total) by mouth 2 (two) times daily as needed for anxiety. 09/23/17  Yes Shawnee Knapp, MD  lamoTRIgine (LAMICTAL) 100 MG tablet Take 1 tablet (100 mg total) by mouth daily for 14 days, THEN 2 tablets (200 mg total) daily for 14 days. 10/21/17 11/18/17 Yes Shawnee Knapp, MD  lamoTRIgine (LAMICTAL) 100 MG tablet Take 2.5 tablets (250 mg total) by mouth daily. 11/18/17  Yes Shawnee Knapp, MD  lamoTRIgine (LAMICTAL) 25 MG tablet Take 1 tablet (25 mg total) by mouth daily for 14 days, THEN 2 tablets (50 mg total) daily for 14 days. 09/23/17 10/21/17 Yes Shawnee Knapp, MD  levothyroxine (SYNTHROID, LEVOTHROID) 50 MCG tablet Take 1 tablet (50 mcg  total) by mouth daily before breakfast. 09/24/17  Yes Shawnee Knapp, MD  norethindrone-ethinyl estradiol-iron (MICROGESTIN FE 1.5/30) 1.5-30 MG-MCG tablet Take 1 tablet by mouth daily. 03/06/17  Yes Shawnee Knapp, MD  sertraline (ZOLOFT) 25 MG tablet Take 1 tablet (25 mg total) by mouth 2 (two) times daily. 09/23/17  Yes Shawnee Knapp, MD  Cholecalciferol (VITAMIN D3) 5000 units CAPS Take 1 capsule (5,000 Units total) by mouth every other day. Patient not taking: Reported on 10/10/2017 09/24/17   Shawnee Knapp, MD    No Known Allergies  Patient Active Problem List   Diagnosis Date Noted  . History of vitamin D deficiency 08/11/2014  . Hyperlipidemia 08/11/2014  . Obesity 08/11/2014  . Asthma 09/23/2011  . Environmental allergies 09/23/2011  . Depression     Past Medical History:  Diagnosis Date  . Allergy   . Asthma   . Depression     Past Surgical History:  Procedure Laterality Date  . APPENDECTOMY      Social History   Socioeconomic History  . Marital status: Divorced    Spouse name: [name]  . Number of children: 1  . Years of education: Not on file  . Highest education level: Not on file  Social Needs  . Financial resource strain: Not on file  . Food insecurity - worry: Not on file  . Food insecurity - inability: Not on file  . Transportation needs - medical:  Not on file  . Transportation needs - non-medical: Not on file  Occupational History  . Not on file  Tobacco Use  . Smoking status: Never Smoker  . Smokeless tobacco: Never Used  Substance and Sexual Activity  . Alcohol use: No  . Drug use: No  . Sexual activity: Not Currently    Birth control/protection: Condom, Pill, Abstinence  Other Topics Concern  . Not on file  Social History Narrative  . Not on file    Family History  Problem Relation Age of Onset  . Hypertension Mother   . Depression Sister      Review of Systems  Constitutional: Positive for chills and fever. Negative for weight loss.    HENT: Positive for congestion. Negative for ear pain and sore throat.   Eyes: Negative.  Negative for discharge and redness.  Respiratory: Positive for cough, shortness of breath and wheezing. Negative for hemoptysis.   Cardiovascular: Negative for chest pain and palpitations.  Gastrointestinal: Negative.  Negative for abdominal pain, diarrhea, heartburn, nausea and vomiting.  Genitourinary: Negative.  Negative for dysuria and hematuria.  Musculoskeletal: Negative.  Negative for back pain, myalgias and neck pain.  Skin: Negative for rash.  Neurological: Negative.  Negative for dizziness, sensory change, focal weakness and headaches.  Endo/Heme/Allergies: Negative.   All other systems reviewed and are negative.   Vitals:   10/10/17 1130  BP: 126/85  Pulse: 84  Resp: 16  Temp: 98.9 F (37.2 C)  SpO2: 98%    Physical Exam  Constitutional: She is oriented to person, place, and time. She appears well-developed and well-nourished.  HENT:  Head: Normocephalic and atraumatic.  Right Ear: External ear normal.  Left Ear: External ear normal.  Nose: Nose normal.  Mouth/Throat: Oropharynx is clear and moist.  Eyes: Conjunctivae and EOM are normal. Pupils are equal, round, and reactive to light.  Neck: Normal range of motion. Neck supple. No JVD present.  Cardiovascular: Normal rate, regular rhythm and normal heart sounds.  Pulmonary/Chest: Effort normal and breath sounds normal. No respiratory distress. She has no wheezes. She has no rales.  Abdominal: Soft. She exhibits no distension. There is no tenderness.  Musculoskeletal: Normal range of motion. She exhibits no edema.  Lymphadenopathy:    She has no cervical adenopathy.  Neurological: She is alert and oriented to person, place, and time. No sensory deficit. She exhibits normal muscle tone.  Skin: Skin is warm and dry. Capillary refill takes less than 2 seconds. No rash noted.  Psychiatric: She has a normal mood and affect. Her  behavior is normal.  Vitals reviewed.    ASSESSMENT & PLAN: Terica was seen today for cough and shortness of breath.  Diagnoses and all orders for this visit:  Cough  History of asthma -     predniSONE (DELTASONE) 20 MG tablet; Take 1 tablet (20 mg total) by mouth daily with breakfast for 5 days.  Acute lower respiratory infection -     azithromycin (ZITHROMAX) 250 MG tablet; Sig as indicated -     predniSONE (DELTASONE) 20 MG tablet; Take 1 tablet (20 mg total) by mouth daily with breakfast for 5 days.    Patient Instructions       IF you received an x-ray today, you will receive an invoice from [facility]. Please contact [facility] at [phone] with questions or concerns regarding your invoice.   IF you received labwork today, you will receive an invoice from [place]. Please contact [facility] at [phone] with  questions or concerns regarding your invoice.   Our billing staff will not be able to assist you with questions regarding bills from these companies.  You will be contacted with the lab results as soon as they are available. The fastest way to get your results is to activate your My Chart account. Instructions are located on the last page of this paperwork. If you have not heard from [place] regarding the results in 2 weeks, please contact this office.     Acute Bronchitis, Adult Acute bronchitis is when air tubes (bronchi) in the lungs suddenly get swollen. The condition can make it hard to breathe. It can also cause these symptoms:  A cough.  Coughing up clear, yellow, or green mucus.  Wheezing.  Chest congestion.  Shortness of breath.  A fever.  Body aches.  Chills.  A sore throat.  Follow these instructions at home: Medicines  Take over-the-counter and prescription medicines only as told by your doctor.  If you were prescribed an antibiotic medicine, take it as told by your doctor. Do not stop taking the antibiotic even  if you start to feel better. General instructions  Rest.  Drink enough fluids to keep your pee (urine) clear or pale yellow.  Avoid smoking and secondhand smoke. If you smoke and you need help quitting, ask your doctor. Quitting will help your lungs heal faster.  Use an inhaler, cool mist vaporizer, or humidifier as told by your doctor.  Keep all follow-up visits as told by your doctor. This is important. How is this prevented? To lower your risk of getting this condition again:  Wash your hands often with soap and water. If you cannot use soap and water, use hand sanitizer.  Avoid contact with people who have cold symptoms.  Try not to touch your hands to your mouth, nose, or eyes.  Make sure to get the flu shot every year.  Contact a doctor if:  Your symptoms do not get better in 2 weeks. Get help right away if:  You cough up blood.  You have chest pain.  You have very bad shortness of breath.  You become dehydrated.  You faint (pass out) or keep feeling like you are going to pass out.  You keep throwing up (vomiting).  You have a very bad headache.  Your fever or chills gets worse. This information is not intended to replace advice given to you by your health care provider. Make sure you discuss any questions you have with your health care provider. Document Released: 01/29/2008 Document Revised: 03/20/2016 Document Reviewed: 01/31/2016 Elsevier Interactive Patient Education  2018 Elsevier Inc.      Agustina Caroli, MD [facility]

## 2017-10-30 ENCOUNTER — Encounter: Payer: Self-pay | Admitting: Family Medicine

## 2017-11-14 DIAGNOSIS — F319 Bipolar disorder, unspecified: Secondary | ICD-10-CM | POA: Diagnosis not present

## 2017-11-18 ENCOUNTER — Other Ambulatory Visit: Payer: Self-pay | Admitting: Family Medicine

## 2017-11-18 NOTE — Telephone Encounter (Signed)
E-rx not currently working. Will try to send in tomorrow a.m. or will ask staff to call in.

## 2017-11-18 NOTE — Telephone Encounter (Signed)
clonezepam refilled - used ~#20/mo for past 2 mos. Pt uses prn panic attacks which had started to recur when she had to go off her psych meds when she lost her psychiatrist.  We restarted her psych meds at her visit in Jan and she has an appointment in April to establish with a new psychiatrist at which time I expect her to transfer the clonazepam rxs as well as lamotrigine and sertraline over to her new pysch for them to rx further. mychart email 3/7 notes that she feels she is doing better now that she has been back on her meds for > 6 wks.

## 2017-11-27 DIAGNOSIS — F319 Bipolar disorder, unspecified: Secondary | ICD-10-CM | POA: Diagnosis not present

## 2017-12-04 DIAGNOSIS — F3111 Bipolar disorder, current episode manic without psychotic features, mild: Secondary | ICD-10-CM | POA: Diagnosis not present

## 2017-12-04 DIAGNOSIS — F410 Panic disorder [episodic paroxysmal anxiety] without agoraphobia: Secondary | ICD-10-CM | POA: Diagnosis not present

## 2017-12-04 DIAGNOSIS — F3131 Bipolar disorder, current episode depressed, mild: Secondary | ICD-10-CM | POA: Diagnosis not present

## 2017-12-11 DIAGNOSIS — F319 Bipolar disorder, unspecified: Secondary | ICD-10-CM | POA: Diagnosis not present

## 2017-12-26 DIAGNOSIS — F319 Bipolar disorder, unspecified: Secondary | ICD-10-CM | POA: Diagnosis not present

## 2017-12-29 ENCOUNTER — Ambulatory Visit (INDEPENDENT_AMBULATORY_CARE_PROVIDER_SITE_OTHER): Payer: BLUE CROSS/BLUE SHIELD | Admitting: Family Medicine

## 2017-12-29 ENCOUNTER — Encounter: Payer: Self-pay | Admitting: Family Medicine

## 2017-12-29 ENCOUNTER — Other Ambulatory Visit: Payer: Self-pay

## 2017-12-29 VITALS — BP 110/76 | HR 78 | Temp 98.3°F | Resp 18 | Ht 62.0 in | Wt 196.6 lb

## 2017-12-29 DIAGNOSIS — Z800 Family history of malignant neoplasm of digestive organs: Secondary | ICD-10-CM | POA: Diagnosis not present

## 2017-12-29 DIAGNOSIS — Z6836 Body mass index (BMI) 36.0-36.9, adult: Secondary | ICD-10-CM | POA: Diagnosis not present

## 2017-12-29 DIAGNOSIS — F3342 Major depressive disorder, recurrent, in full remission: Secondary | ICD-10-CM

## 2017-12-29 DIAGNOSIS — M545 Low back pain, unspecified: Secondary | ICD-10-CM

## 2017-12-29 DIAGNOSIS — E6609 Other obesity due to excess calories: Secondary | ICD-10-CM

## 2017-12-29 DIAGNOSIS — E039 Hypothyroidism, unspecified: Secondary | ICD-10-CM

## 2017-12-29 MED ORDER — LEVOTHYROXINE SODIUM 75 MCG PO TABS: 75.0000 ug | ORAL_TABLET | Freq: Every day | ORAL | 0 refills | Status: DC

## 2017-12-29 MED ORDER — PHENTERMINE HCL 37.5 MG PO TABS: 18.7500 mg | ORAL_TABLET | Freq: Every day | ORAL | 1 refills | Status: DC

## 2017-12-29 NOTE — Patient Instructions (Addendum)
Depending on your [employer] plan, nutrition services may be covered for just the cost of a copay or even free. Check with your insurance to learn more about your coverage or the nutritionist can help you do this. A local nutritionist is [employer] - I think she runs it out of her house - but it very pro-food and helps you figure out how to enjoy the things you like most while cutting corners where you don't need so you don't have to diet but can live a healthy lifestyle for your body.  Check out her website:  [employer] [place] [address]  [place], [place] [address]  [phone]  Or  [facility] has a new weight loss clinic in [place] on [address]. So far, they seem to be getting good results and since it is medically based your insurance might provide better coverage.  It is probably worth checking to see what your ins coverage is. It is called [employer] and you can call [id number] option 1 for more info.     IF you received an x-ray today, you will receive an invoice from [facility]. Please contact [facility] at [phone] with questions or concerns regarding your invoice.   IF you received labwork today, you will receive an invoice from [facility]. Please contact [facility] at [phone] with questions or concerns regarding your invoice.   Our billing staff will not be able to assist you with questions regarding bills from these companies.  You will be contacted with the lab results as soon as they are available. The fastest way to get your results is to activate your My Chart account. Instructions are located on the last page of this paperwork. If you have not heard from [place] regarding the results in 2 weeks, please contact this office.     Colorectal Cancer Screening Colorectal cancer screening is a group of tests used to check for colorectal cancer. Colorectal refers to your colon and rectum. Your colon and rectum are located at  the end of your large intestine and carry your bowel movements out of your body. Why is colorectal cancer screening done? It is common for abnormal growths (polyps) to form in the lining of your colon, especially as you get older. These polyps can be cancerous or become cancerous. If colorectal cancer is found at an early stage, it is treatable. Who should be screened for colorectal cancer? Screening is recommended for all adults at average risk starting at age 17. Tests may be recommended every 1 to 10 years. Your health care provider may recommend earlier or more frequent screening if you have:  A history of colorectal cancer or polyps.  A family member with a history of colorectal cancer or polyps.  Inflammatory bowel disease, such as ulcerative colitis or Crohn disease.  A type of hereditary colon cancer syndrome.  Colorectal cancer symptoms.  Types of screening tests There are several types of colorectal screening tests. They include:  Guaiac-based fecal occult blood testing.  Fecal immunochemical test (FIT).  Stool DNA test.  Barium enema.  Virtual colonoscopy.  Sigmoidoscopy. During this test, a sigmoidoscope is used to examine your rectum and lower colon. A sigmoidoscope is a flexible tube with a camera that is inserted through your anus into your rectum and lower colon.  Colonoscopy. During this test, a colonoscope is used to examine your entire colon. A colonoscope is a long, thin, flexible tube with a camera. This test examines your entire colon and  rectum.  This information is not intended to replace advice given to you by your health care provider. Make sure you discuss any questions you have with your health care provider. Document Released: 01/30/2010 Document Revised: 03/21/2016 Document Reviewed: 11/18/2013 Elsevier Interactive Patient Education  2018 [employer].  Preventing Complications From Unhealthy Weight Loss Behaviors, Adult Reaching and maintaining a  healthy weight is important for your overall health. It is natural to want to lose weight quickly, using whatever methods seem fastest. However, losing weight in a healthy way is not a quick process. Instead, aim for slow, steady weight loss. What lifestyle changes can be made? You can make certain lifestyle changes to help you lose weight in a healthy way. These include eating nutritious foods and exercising regularly. What to avoid:  Following a diet that restricts entire types of food.  Skipping meals to save calories. It is especially important to eat breakfast.  Not eating anything for long periods of time (fasting).  Restricting your calories to far less than the amount that you need to lose or maintain a healthy weight.  Compulsively getting an extreme amount of exercise.  Taking laxative pills to make you have more frequent bowel movements.  Taking medicines to make your body lose excess fluids (diuretics).  Eating an excessive amount of food (bingeing), then making yourself vomit (purging). Healthy behaviors:   Eat a variety of healthy foods, including: ? Fruits and vegetables. ? Whole grains. ? Lean proteins. ? Low-fat dairy products.  Drink water instead of sugary drinks.  Plan healthy, low-calorie meals. Work with a Financial planner (dietitian) to make a healthy meal plan that works for you and to make an exercise program. ? Include different types of exercise in your exercise program, such as strengthening, aerobic, and flexibility exercises. ? To maintain your weight, get at least 150 minutes of moderate-intensity exercise every week. Moderate-intensity exercise could be brisk walking or biking. ? To lose a healthy amount of weight, get 60 minutes of moderate-intensity exercise each day.  Find ways to reduce stress, such as regular exercise or meditation.  Find a hobby or other activity that you enjoy to distract you from eating when you feel stressed or  bored. Why are these changes important? Making these changes will improve your overall health. Maintaining a healthy weight also lowers your risk of certain conditions, including:  Heart disease.  High cholesterol.  High blood pressure.  Type 2 diabetes.  Stroke.  Osteoarthritis.  Osteoporosis.  Some types of cancer.  Breathing and sleeping disorders.  What can happen if changes are not made? Using disordered eating or other unhealthy eating behaviors to try to lose weight can cause:  Fatigue.  Imbalances in electrolytes and chemicals that your body needs to work properly.  Organ damage or failure, especially of the kidneys.  Dehydration.  Imbalances in body fluids.  Low heart rate and blood pressure.  Thin bones that break easily.  Social isolation or relationship problems with your friends and family.  Emotional distress, including depression and anxiety.  A greater risk of an eating disorder.  If you develop an eating disorder, you could develop serious health problems and complications that affect yourorgans and bodily processes. These include:  Dry skin and hair.  Hair loss.  Fainting.  Difficulty getting pregnant.  Changes in your heart muscle and the way your heart works.  Severe dehydration.  Damage to the digestive tract and digestive problems.  Long-term (chronic) gastrointestinal problems.  High blood pressure.  High cholesterol.  Heart disease.  Type 2 diabetes.  Where to find support: For more support, talk with:  Your health care provider or dietitian. Ask about support groups.  A mental health care provider.  Family and friends.  Where to find more information: Learn more about how to prevent complications from unhealthy weight loss behaviors from:  Centers for Disease Control and Prevention: https://www.cervantes-rivera.net/  [place]:  PureMess.co.nz.shtml  National Eating Disorders Association: www.nationaleatingdisorders.org  Contact a health care provider if:  You often feel very tired.  You notice changes in your skin or your hair.  You faint because of dehydration or too much exercise.  You struggle to change your unhealthy weight loss behaviors on your own.  Unhealthy weight loss behaviors are affecting your daily life.  You have signs or symptoms of an eating disorder.  You have major weight changes in a short period of time.  You feel guilty or ashamed about eating or exercising.  You have trouble with your relationships because of your weight loss habits. Summary  Aim for slow, steady weight loss by choosing healthy foods, getting enough calories every day, and exercising regularly.  If you cannot make these changes by yourself, or if you think that you may have an eating disorder, contact your health care provider. This information is not intended to replace advice given to you by your health care provider. Make sure you discuss any questions you have with your health care provider. Document Released: 08/27/2015 Document Revised: 03/01/2016 Document Reviewed: 08/27/2015 Elsevier Interactive Patient Education  2018 [employer].

## 2017-12-29 NOTE — Progress Notes (Addendum)
Subjective:  By signing my name below, I, [name], attest that this documentation has been prepared under the direction and in the presence of Delman Cheadle, MD Electronically Signed: Ladene Artist, ED Scribe 12/29/2017 at 3:47 PM.   Patient ID: Selena Chapman, female    DOB: [birth year], 47 y.o.   MRN: [id number]  Chief Complaint  Patient presents with  . Medication Management    Pt would like to discuss weight loss management and weight loss meds.   HPI Selena Chapman is a 47 y.o. female who presents to Primary Care at [facility] to discuss weight loss.   Subclinical Hypothyroidism  Initial TSH 7.73 but multiple hypothyroid symptoms of dry skin, dry hair, cold intolerance, fatigue, weight gain, tender thyroid. Started levothyroxine 50, symptoms improved in 3 wks. Recheck labs 3 months prior showed normal TSH so continued levothyroxine 50 supplement.  However, for the past month, all of the symptoms have returned.  Depression Pt started seeing Dr. Toy Care. Currently on Tegretol at night and 400 mg Lamictal. Zoloft was stopped. She states her mood has been stable overall.  Obesity  Interested in weight loss meds and options but when last seen 3 months prior had been off some psych meds due to lost of provider and transition so she agreed it best that she get established with her new provider Dr. Toy Care and get psych meds restarted/adjusted prior to discussing weight loss meds.  Pt states she has been eating 1100 calories (1 meal a day, snacks throughout the day) and exercising. She recently started C25K and occasionally lifts.  Pt has never tried phentermine but is willing to try it at this visit. She has tried Wellbutrin in the past, noted weight loss initially which leveled off. States she was on Wellbutrin for several yrs but noticed a "jittery" sensation once while on it.  Wt Readings from Last 3 Encounters:  12/29/17 196 lb 9.6 oz (89.2 kg)  10/10/17 201 lb 12.8 oz (91.5 kg)    09/23/17 206 lb (93.4 kg)   Back Pain Pt reports intermittent, non-radiating L sided low back pain just above the L hip. Pt states she fell down steps while holding her mom's dogs just prior to going to [place]. She reports increased pain at night after exercising. Pt has tried stretching in the sauna and Aleve with some relief. Also suspects that she needs more running shoes which will probably improve back pain.  Health Maintenance Maternal aunt (diagnosed at 69) and both grandmothers with a h/o colon CA.  Past Medical History:  Diagnosis Date  . Allergy   . Asthma   . Depression    Current Outpatient Medications on File Prior to Visit  Medication Sig Dispense Refill  . carbamazepine (TEGRETOL) 200 MG tablet Take 200 mg by mouth 3 (three) times daily.    Marland Kitchen lamoTRIgine (LAMICTAL) 100 MG tablet Take 2.5 tablets (250 mg total) by mouth daily. 75 tablet 2  . levothyroxine (SYNTHROID, LEVOTHROID) 50 MCG tablet Take 1 tablet (50 mcg total) by mouth daily before breakfast. 90 tablet 1  . norethindrone-ethinyl estradiol-iron (MICROGESTIN FE 1.5/30) 1.5-30 MG-MCG tablet Take 1 tablet by mouth daily. 84 tablet 4  . albuterol (PROAIR HFA) 108 (90 Base) MCG/ACT inhaler INHALE 2 PUFFS INTO THE LUNGS EVERY 4 HOURS AS NEEDED 8.5 g 3  . azithromycin (ZITHROMAX) 250 MG tablet Sig as indicated (Patient not taking: Reported on 12/29/2017) 6 tablet 0  . Cholecalciferol (VITAMIN D) 2000 units CAPS Take by mouth.    Marland Kitchen  Cholecalciferol (VITAMIN D3) 5000 units CAPS Take 1 capsule (5,000 Units total) by mouth every other day. (Patient not taking: Reported on 10/10/2017) 1 capsule   . clonazePAM (KLONOPIN) 1 MG tablet TAKE 1 TABLET BY MOUTH TWICE DAILY AS NEEDED FOR ANXIETY 20 tablet 1  . lamoTRIgine (LAMICTAL) 100 MG tablet Take 1 tablet (100 mg total) by mouth daily for 14 days, THEN 2 tablets (200 mg total) daily for 14 days. 42 tablet 0  . lamoTRIgine (LAMICTAL) 25 MG tablet Take 1 tablet (25 mg total) by mouth  daily for 14 days, THEN 2 tablets (50 mg total) daily for 14 days. 42 tablet 0  . sertraline (ZOLOFT) 25 MG tablet Take 1 tablet (25 mg total) by mouth 2 (two) times daily. (Patient not taking: Reported on 12/29/2017) 180 tablet 1   No current facility-administered medications on file prior to visit.    Past Surgical History:  Procedure Laterality Date  . APPENDECTOMY     No Known Allergies Family History  Problem Relation Age of Onset  . Hypertension Mother   . Depression Sister    Social History   Socioeconomic History  . Marital status: Divorced    Spouse name: [name]  . Number of children: 1  . Years of education: Not on file  . Highest education level: Not on file  Occupational History  . Not on file  Social Needs  . Financial resource strain: Not on file  . Food insecurity:    Worry: Not on file    Inability: Not on file  . Transportation needs:    Medical: Not on file    Non-medical: Not on file  Tobacco Use  . Smoking status: Never Smoker  . Smokeless tobacco: Never Used  Substance and Sexual Activity  . Alcohol use: No  . Drug use: No  . Sexual activity: Not Currently    Birth control/protection: Condom, Pill, Abstinence  Lifestyle  . Physical activity:    Days per week: Not on file    Minutes per session: Not on file  . Stress: Not on file  Relationships  . Social connections:    Talks on phone: Not on file    Gets together: Not on file    Attends religious service: Not on file    Active member of club or organization: Not on file    Attends meetings of clubs or organizations: Not on file    Relationship status: Not on file  Other Topics Concern  . Not on file  Social History Narrative  . Not on file   Depression screen [facility] 2/9 12/29/2017 10/10/2017 03/06/2017 09/03/2016 09/14/2015  Decreased Interest 0 0 0 0 0  Down, Depressed, Hopeless 0 0 0 0 0  PHQ - 2 Score 0 0 0 0 0     Review of Systems  Musculoskeletal: Positive for back pain.        Objective:   Physical Exam  Constitutional: She is oriented to person, place, and time. She appears well-developed and well-nourished. No distress.  HENT:  Head: Normocephalic and atraumatic.  Eyes: Conjunctivae and EOM are normal.  Neck: Neck supple. No tracheal deviation present.  Cardiovascular: Normal rate.  Pulmonary/Chest: Effort normal. No respiratory distress.  Musculoskeletal: Normal range of motion.  Point tenderness over L4-S1.   Neurological: She is alert and oriented to person, place, and time.  Skin: Skin is warm and dry.  Psychiatric: She has a normal mood and affect. Her behavior is normal.  Nursing note and vitals reviewed.  BP 110/76 (BP Location: Left Arm, Patient Position: Sitting, Cuff Size: Large)   Pulse 78   Temp 98.3 F (36.8 C) (Oral)   Resp 18   Ht 5' 2"  (1.575 m)   Wt 196 lb 9.6 oz (89.2 kg)   LMP 12/01/2017   SpO2 100%   BMI 35.96 kg/m     Assessment & Plan:   1. Acquired hypothyroidism - was subclinical but had good sxs response when starting levothyroxine 50 mcg >3 mos ago- however, now sxs have recurred so try increasing dose to 75 and recheck levels and weight in 2 mos. No refills w/o OV but does need to be on med continuously for 6 wks before we can accurately assess levels so ok to refill for just sev wks or so if for some reason f/u is delayed  2. Class 2 obesity due to excess calories without serious comorbidity with body mass index (BMI) of 36.0 to 36.9 in adult - will do trial of phentermine as she is doing AMAZING on workout and following very strict diet and has been > 6 mos - continues to make progress at every visit - lost 5 lbs in <3 mos and lost >20 lbs in past yr w/ BMI decreasing from 39.3 to 36 but still has a ways to go to get BMI <30 and is starting to burn out with the slow progress so hoping phenteramine and adjusting levothyroxine will boost her efforts.  Stop immed if worsening anx or mood. Recheck in 6-8 wks for any additional  refills - take med morning of appt so we can check vitals on it. Ref[facility] to [facility]. Leafy Ro at [facility] Weight loss through [facility] but check out [facility]" program and what private nutritionists covered under ins in interim Has considered surgery in past yrs and went to initial surgeon visit but was to costly and now motivated to do it on own - harder but she is confident she can and that then it will stay off in future more req because she will be used to the lifestyle changes  3. Family history of colon cancer - no need for early GI referral since not first degree relative and not at age <42 yo  75. Recurrent major depressive disorder, in full remission [facility]) - now seeing psych Dr. Harlene Ramus  5. Acute bilateral low back pain without sciatica     Orders Placed This Encounter  Procedures  . TSH+T4F+T3Free  . Amb Ref to Medical Weight Management    Referral Priority:   Routine    Referral Type:   Consultation    Referred to Provider:   Starlyn Skeans, MD    Number of Visits Requested:   1    Meds ordered this encounter  Medications  . levothyroxine (SYNTHROID, LEVOTHROID) 75 MCG tablet    Sig: Take 1 tablet (75 mcg total) by mouth daily before breakfast.    Dispense:  90 tablet    Refill:  0    D/C levothyroxine 50  . phentermine (ADIPEX-P) 37.5 MG tablet    Sig: Take 0.5 tablets (18.75 mg total) by mouth daily before breakfast. And 0.5 tab daily before lunch if needed    Dispense:  30 tablet    Refill:  1    I personally performed the services described in this documentation, which was scribed in my presence. The recorded information has been reviewed and considered, and addended by me as needed.  Delman Cheadle, M.D.  Primary Care at [facility] [address] [place], [place] [address] [phone] phone [phone] fax  01/18/18 3:53 PM

## 2017-12-30 LAB — TSH+T4F+T3FREE
Free T4: 1.22 ng/dL (ref 0.82–1.77)
T3, Free: 2.1 pg/mL (ref 2.0–4.4)
TSH: 1.62 u[IU]/mL (ref 0.450–4.500)

## 2018-01-01 ENCOUNTER — Telehealth: Payer: Self-pay | Admitting: Family Medicine

## 2018-01-01 NOTE — Telephone Encounter (Signed)
Notified pt via mychart advising that it may take up to 2 weeks before labs are released.

## 2018-01-01 NOTE — Telephone Encounter (Signed)
Copied from [place] [id number]. Topic: Quick Communication - See Telephone Encounter >> Jan 01, 2018 12:58 PM Vernona Rieger wrote: CRM for notification. See Telephone encounter for: 01/01/18.  Patient is requesting her lab results from Monday 5/5. Please call at [phone]

## 2018-01-07 ENCOUNTER — Other Ambulatory Visit: Payer: Self-pay | Admitting: Family Medicine

## 2018-01-07 NOTE — Telephone Encounter (Signed)
Klonopin 1 mg refill request  LOV 09/23/17 with Dr. Brigitte Pulse  Last refill:  11/19/17  #20   With 1 refill  [facility] [id number], [place] - [place].

## 2018-01-09 DIAGNOSIS — F319 Bipolar disorder, unspecified: Secondary | ICD-10-CM | POA: Diagnosis not present

## 2018-01-23 MED ORDER — CLONAZEPAM 1 MG PO TABS: 1.0000 mg | ORAL_TABLET | Freq: Three times a day (TID) | ORAL | 1 refills | Status: AC | PRN

## 2018-01-23 NOTE — Telephone Encounter (Signed)
Today I have utilized the [place] Controlled Substance Registry's online query to confirm compliance regarding the patient's controlled medications. My review reveals appropriate prescription fills and that I am the sole provider of these medications. Rechecks will occur regularly and the patient is aware of our use of the system.   KLONOPIN WAS REFILLED BY Dr. Toy Care on 5/16 for #90 so I called [facility] and cancelled the rx I sent on 5/31 for #20 no refills a few minutes before I saw on the database that she was now receiving this med from psych

## 2018-02-03 ENCOUNTER — Other Ambulatory Visit: Payer: Self-pay

## 2018-02-03 ENCOUNTER — Ambulatory Visit (INDEPENDENT_AMBULATORY_CARE_PROVIDER_SITE_OTHER): Payer: BLUE CROSS/BLUE SHIELD | Admitting: Family Medicine

## 2018-02-03 ENCOUNTER — Encounter: Payer: Self-pay | Admitting: Family Medicine

## 2018-02-03 VITALS — BP 110/72 | HR 88 | Temp 97.9°F | Ht 62.0 in | Wt 187.8 lb

## 2018-02-03 DIAGNOSIS — E6609 Other obesity due to excess calories: Secondary | ICD-10-CM

## 2018-02-03 DIAGNOSIS — H6982 Other specified disorders of Eustachian tube, left ear: Secondary | ICD-10-CM

## 2018-02-03 DIAGNOSIS — E66812 Obesity, class 2: Secondary | ICD-10-CM

## 2018-02-03 DIAGNOSIS — J029 Acute pharyngitis, unspecified: Secondary | ICD-10-CM | POA: Diagnosis not present

## 2018-02-03 DIAGNOSIS — J320 Chronic maxillary sinusitis: Secondary | ICD-10-CM

## 2018-02-03 DIAGNOSIS — E039 Hypothyroidism, unspecified: Secondary | ICD-10-CM | POA: Diagnosis not present

## 2018-02-03 DIAGNOSIS — Z6837 Body mass index (BMI) 37.0-37.9, adult: Secondary | ICD-10-CM

## 2018-02-03 DIAGNOSIS — H6992 Unspecified Eustachian tube disorder, left ear: Secondary | ICD-10-CM

## 2018-02-03 LAB — POCT RAPID STREP A (OFFICE): Rapid Strep A Screen: NEGATIVE

## 2018-02-03 MED ORDER — PHENTERMINE HCL 37.5 MG PO TABS: 18.7500 mg | ORAL_TABLET | Freq: Every day | ORAL | 1 refills | Status: DC

## 2018-02-03 MED ORDER — METHYLPREDNISOLONE ACETATE 80 MG/ML IJ SUSP
120.0000 mg | Freq: Once | INTRAMUSCULAR | Status: AC
  Administered 2018-02-03: 120 mg via INTRAMUSCULAR

## 2018-02-03 MED ORDER — AMOXICILLIN-POT CLAVULANATE 875-125 MG PO TABS: 1.0000 | ORAL_TABLET | Freq: Two times a day (BID) | ORAL | 0 refills | Status: DC

## 2018-02-03 NOTE — Patient Instructions (Addendum)
If your ears don't feel better in 3 days, start taking the prednisone 56m daily x 5d on Saturday morning.    IF you received an x-ray today, you will receive an [facility]. Please contact [facility] at [phone] questions or concerns regarding your invoice.   IF you received labwork today, you will receive an invoice from [facility] Please contact [facility] at [phone] questions or concerns regarding your invoice.   Our billing staff will not be able to assist you with questions regarding bills from these companies.  You will be contacted with the lab results as soon as they are available. The fastest way to get your results is to activate your My Chart account. Instructions are located on the last page of this paperwork. If you have not heard from [facility] results in 2 weeks, please contact this office.       Eustachian Tube Dysfunction The eustachian tube connects the middle ear to the back of the nose. It regulates air pressure in the middle ear by allowing air to move between the ear and nose. It also helps to drain fluid from the middle ear space. When the eustachian tube does not function properly, air pressure, fluid, or both can build up in the middle ear. Eustachian tube dysfunction can affect one or both ears. What are the causes? This condition happens when the eustachian tube becomes blocked or cannot open normally. This may result from:  Ear infections.  Colds and other upper respiratory infections.  Allergies.  Irritation, such as from cigarette smoke or acid from the stomach coming up into the esophagus (gastroesophageal reflux).  Sudden changes in air pressure, such as from descending in an airplane.  Abnormal growths in the nose or throat, such as nasal polyps, tumors, or enlarged tissue at the back of the throat (adenoids).  What increases the risk? This condition may be more likely to develop in people who  smoke and people who are overweight. Eustachian tube dysfunction may also be more likely to develop in children, especially children who have:  Certain birth defects of the mouth, such as cleft palate.  Large tonsils and adenoids.  What are the signs or symptoms? Symptoms of this condition may include:  A feeling of fullness in the ear.  Ear pain.  Clicking or popping noises in the ear.  Ringing in the ear.  Hearing loss.  Loss of balance.  Symptoms may get worse when the air pressure around you changes, such as when you travel to an area of high elevation or fly on an airplane. How is this diagnosed? This condition may be diagnosed based on:  Your symptoms.  A physical exam of your ear, nose, and throat.  Tests, such as those that measure: ? The movement of your eardrum (tympanogram). ? Your hearing (audiometry).  How is this treated? Treatment depends on the cause and severity of your condition. If your symptoms are mild, you may be able to relieve your symptoms by moving air into ("popping") your ears. If you have symptoms of fluid in your ears, treatment may include:  Decongestants.  Antihistamines.  Nasal sprays or ear drops that contain medicines that reduce swelling (steroids).  In some cases, you may need to have a procedure to drain the fluid in your eardrum (myringotomy). In this procedure, a small tube is placed in the eardrum to:  Drain the fluid.  Restore the air in the middle ear space.  Follow these instructions  at home:  Take over-the-counter and prescription medicines only as told by your health care provider.  Use techniques to help pop your ears as recommended by your health care provider. These may include: ? Chewing gum. ? Yawning. ? Frequent, forceful swallowing. ? Closing your mouth, holding your nose closed, and gently blowing as if you are trying to blow air out of your nose.  Do not do any of the following until your health care  provider approves: ? Travel to high altitudes. ? Fly in airplanes. ? Work in a Pension scheme manager or room. ? Scuba dive.  Keep your ears dry. Dry your ears completely after showering or bathing.  Do not smoke.  Keep all follow-up visits as told by your health care provider. This is important. Contact a health care provider if:  Your symptoms do not go away after treatment.  Your symptoms come back after treatment.  You are unable to pop your ears.  You have: ? A fever. ? Pain in your ear. ? Pain in your head or neck. ? Fluid draining from your ear.  Your hearing suddenly changes.  You become very dizzy.  You lose your balance. This information is not intended to replace advice given to you by your health care provider. Make sure you discuss any questions you have with your health care provider. Document Released: 09/08/2015 Document Revised: 01/18/2016 Document Reviewed: 08/31/2014 Elsevier Interactive Patient Education  2018 [employer].   Sinusitis, Adult Sinusitis is soreness and inflammation of your sinuses. Sinuses are hollow spaces in the bones around your face. Your sinuses are located:  Around your eyes.  In the middle of your forehead.  Behind your nose.  In your cheekbones.  Your sinuses and nasal passages are lined with a stringy fluid (mucus). Mucus normally drains out of your sinuses. When your nasal tissues become inflamed or swollen, the mucus can become trapped or blocked so air cannot flow through your sinuses. This allows bacteria, viruses, and funguses to grow, which leads to infection. Sinusitis can develop quickly and last for 7?10 days (acute) or for more than 12 weeks (chronic). Sinusitis often develops after a cold. What are the causes? This condition is caused by anything that creates swelling in the sinuses or stops mucus from draining, including:  Allergies.  Asthma.  Bacterial or viral infection.  Abnormally shaped bones between  the nasal passages.  Nasal growths that contain mucus (nasal polyps).  Narrow sinus openings.  Pollutants, such as chemicals or irritants in the air.  A foreign object stuck in the nose.  A fungal infection. This is rare.  What increases the risk? The following factors may make you more likely to develop this condition:  Having allergies or asthma.  Having had a recent cold or respiratory tract infection.  Having structural deformities or blockages in your nose or sinuses.  Having a weak immune system.  Doing a lot of swimming or diving.  Overusing nasal sprays.  Smoking.  What are the signs or symptoms? The main symptoms of this condition are pain and a feeling of pressure around the affected sinuses. Other symptoms include:  Upper toothache.  Earache.  Headache.  Bad breath.  Decreased sense of smell and taste.  A cough that may get worse at night.  Fatigue.  Fever.  Thick drainage from your nose. The drainage is often green and it may contain pus (purulent).  Stuffy nose or congestion.  Postnasal drip. This is when extra mucus collects in the  throat or back of the nose.  Swelling and warmth over the affected sinuses.  Sore throat.  Sensitivity to light.  How is this diagnosed? This condition is diagnosed based on symptoms, a medical history, and a physical exam. To find out if your condition is acute or chronic, your health care provider may:  Look in your nose for signs of nasal polyps.  Tap over the affected sinus to check for signs of infection.  View the inside of your sinuses using an imaging device that has a light attached (endoscope).  If your health care provider suspects that you have chronic sinusitis, you may also:  Be tested for allergies.  Have a sample of mucus taken from your nose (nasal culture) and checked for bacteria.  Have a mucus sample examined to see if your sinusitis is related to an allergy.  If your sinusitis  does not respond to treatment and it lasts longer than 8 weeks, you may have an MRI or CT scan to check your sinuses. These scans also help to determine how severe your infection is. In rare cases, a bone biopsy may be done to rule out more serious types of fungal sinus disease. How is this treated? Treatment for sinusitis depends on the cause and whether your condition is chronic or acute. If a virus is causing your sinusitis, your symptoms will go away on their own within 10 days. You may be given medicines to relieve your symptoms, including:  Topical nasal decongestants. They shrink swollen nasal passages and let mucus drain from your sinuses.  Antihistamines. These drugs block inflammation that is triggered by allergies. This can help to ease swelling in your nose and sinuses.  Topical nasal corticosteroids. These are nasal sprays that ease inflammation and swelling in your nose and sinuses.  Nasal saline washes. These rinses can help to get rid of thick mucus in your nose.  If your condition is caused by bacteria, you will be given an antibiotic medicine. If your condition is caused by a fungus, you will be given an antifungal medicine. Surgery may be needed to correct underlying conditions, such as narrow nasal passages. Surgery may also be needed to remove polyps. Follow these instructions at home: Medicines  Take, use, or apply over-the-counter and prescription medicines only as told by your health care provider. These may include nasal sprays.  If you were prescribed an antibiotic medicine, take it as told by your health care provider. Do not stop taking the antibiotic even if you start to feel better. Hydrate and Humidify  Drink enough water to keep your urine clear or pale yellow. Staying hydrated will help to thin your mucus.  Use a cool mist humidifier to keep the humidity level in your home above 50%.  Inhale steam for 10-15 minutes, 3-4 times a day or as told by your health  care provider. You can do this in the bathroom while a hot shower is running.  Limit your exposure to cool or dry air. Rest  Rest as much as possible.  Sleep with your head raised (elevated).  Make sure to get enough sleep each night. General instructions  Apply a warm, moist washcloth to your face 3-4 times a day or as told by your health care provider. This will help with discomfort.  Wash your hands often with soap and water to reduce your exposure to viruses and other germs. If soap and water are not available, use hand sanitizer.  Do not smoke. Avoid being around  people who are smoking (secondhand smoke).  Keep all follow-up visits as told by your health care provider. This is important. Contact a health care provider if:  You have a fever.  Your symptoms get worse.  Your symptoms do not improve within 10 days. Get help right away if:  You have a severe headache.  You have persistent vomiting.  You have pain or swelling around your face or eyes.  You have vision problems.  You develop confusion.  Your neck is stiff.  You have trouble breathing. This information is not intended to replace advice given to you by your health care provider. Make sure you discuss any questions you have with your health care provider. Document Released: 08/12/2005 Document Revised: 04/07/2016 Document Reviewed: 06/07/2015 Elsevier Interactive Patient Education  [employer].

## 2018-02-03 NOTE — Progress Notes (Signed)
Subjective:  By signing my name below, I, [name], attest that this documentation has been prepared under the direction and in the presence of Delman Cheadle, MD Electronically Signed: Ladene Artist, ED Scribe 02/03/2018 at 9:51 AM.   Patient ID: Selena Chapman, female    DOB: 07-12-1971, 47 y.o.   MRN: [id number]  Chief Complaint  Patient presents with  . Ear Fullness    x 1 week and half in both ears but mainly in the left   . Fever    low grade fever  . Sore Throat    trouble with swallowing    HPI Selena Chapman is a 47 y.o. female who presents to Primary Care at [facility] complaining of bilateral ear fullness, L worse than R, x 1.5 wk. Pt reports associated symptoms of intermittent low-grade fever with Tmax of 99.6 F, intermittent sore throat, ethmoid sinus pressure on the L. She has tried ibuprofen, Chloraseptic, Neti pot, sudafed for a few days. Denies cough. No known sick contacts. Given prednisone and z-pak in Feb.  Rash Pt reports a pruritic rash under L arm, worse after running. She has applied clotrimazole cream.  Hypothyroidism Started new dose of synthroid 6 wks ago. Wt Readings from Last 3 Encounters:  02/03/18 187 lb 12.8 oz (85.2 kg)  12/29/17 196 lb 9.6 oz (89.2 kg)  10/10/17 201 lb 12.8 oz (91.5 kg)    Past Medical History:  Diagnosis Date  . Allergy   . Asthma   . Depression    Current Outpatient Medications on File Prior to Visit  Medication Sig Dispense Refill  . albuterol (PROAIR HFA) 108 (90 Base) MCG/ACT inhaler INHALE 2 PUFFS INTO THE LUNGS EVERY 4 HOURS AS NEEDED 8.5 g 3  . carbamazepine (TEGRETOL) 200 MG tablet Take 200 mg by mouth daily.     . Cholecalciferol (VITAMIN D3) 5000 units CAPS Take 1 capsule (5,000 Units total) by mouth every other day. 1 capsule   . clonazePAM (KLONOPIN) 1 MG tablet Take 1 tablet (1 mg total) by mouth 3 (three) times daily as needed for anxiety. for anxiety (Patient taking differently: Take 1 mg by mouth as needed for  anxiety. for anxiety) 90 tablet 1  . lamoTRIgine (LAMICTAL) 200 MG tablet Take 400 mg by mouth at bedtime.  12  . levothyroxine (SYNTHROID, LEVOTHROID) 75 MCG tablet Take 1 tablet (75 mcg total) by mouth daily before breakfast. 90 tablet 0  . norethindrone-ethinyl estradiol-iron (MICROGESTIN FE 1.5/30) 1.5-30 MG-MCG tablet Take 1 tablet by mouth daily. 84 tablet 4  . phentermine (ADIPEX-P) 37.5 MG tablet Take 0.5 tablets (18.75 mg total) by mouth daily before breakfast. And 0.5 tab daily before lunch if needed 30 tablet 1   No current facility-administered medications on file prior to visit.    Past Surgical History:  Procedure Laterality Date  . APPENDECTOMY      No Known Allergies Family History  Problem Relation Age of Onset  . Hypertension Mother   . Depression Sister    Social History   Socioeconomic History  . Marital status: Divorced    Spouse name: [name]  . Number of children: 1  . Years of education: Not on file  . Highest education level: Not on file  Occupational History  . Not on file  Social Needs  . Financial resource strain: Not on file  . Food insecurity:    Worry: Not on file    Inability: Not on file  . Transportation needs:  Medical: Not on file    Non-medical: Not on file  Tobacco Use  . Smoking status: Never Smoker  . Smokeless tobacco: Never Used  Substance and Sexual Activity  . Alcohol use: No  . Drug use: No  . Sexual activity: Not Currently    Birth control/protection: Condom, Pill, Abstinence  Lifestyle  . Physical activity:    Days per week: Not on file    Minutes per session: Not on file  . Stress: Not on file  Relationships  . Social connections:    Talks on phone: Not on file    Gets together: Not on file    Attends religious service: Not on file    Active member of club or organization: Not on file    Attends meetings of clubs or organizations: Not on file    Relationship status: Not on file  Other Topics Concern  .  Not on file  Social History Narrative  . Not on file   Depression screen [facility] 2/9 02/03/2018 12/29/2017 10/10/2017 03/06/2017 09/03/2016  Decreased Interest 0 0 0 0 0  Down, Depressed, Hopeless 0 0 0 0 0  PHQ - 2 Score 0 0 0 0 0     Review of Systems  Constitutional: Positive for fever (low-grade).  HENT: Positive for ear pain, sinus pressure and sore throat.   Respiratory: Negative for cough.   Skin: Positive for rash.      Objective:   Physical Exam  Constitutional: She is oriented to person, place, and time. She appears well-developed and well-nourished. No distress.  HENT:  Head: Normocephalic and atraumatic.  Right Ear: Tympanic membrane is injected and retracted.  Left Ear: Tympanic membrane is injected and retracted. A middle ear effusion (small) is present.  Nose: Mucosal edema (L worse than R) and rhinorrhea present. Left sinus exhibits maxillary sinus tenderness (and ethmoid).  Mouth/Throat: Posterior oropharyngeal erythema present. No oropharyngeal exudate.  TMs: L worse than R Mouth/Throat: Purulent postnasal drip  Eyes: Conjunctivae and EOM are normal.  Neck: Neck supple. No tracheal deviation present.  Cardiovascular: Normal rate.  Pulmonary/Chest: Effort normal. No respiratory distress.  Musculoskeletal: Normal range of motion.  Lymphadenopathy:       Head (right side): Tonsillar adenopathy present.       Head (left side): Tonsillar adenopathy present.    She has cervical adenopathy (anterior, L worse than R).       Right: No supraclavicular adenopathy present.       Left: No supraclavicular adenopathy present.  Neurological: She is alert and oriented to person, place, and time.  Skin: Skin is warm and dry.  Psychiatric: She has a normal mood and affect. Her behavior is normal.  Nursing note and vitals reviewed.  BP 110/72 (BP Location: Left Arm, Patient Position: Sitting, Cuff Size: Normal)   Pulse 88   Temp 97.9 F (36.6 C) (Oral)   Ht 5' 2"  (1.575 m)   Wt  187 lb 12.8 oz (85.2 kg)   SpO2 99%   BMI 34.35 kg/m     Results for orders placed or performed in visit on 02/03/18  POCT rapid strep A  Result Value Ref Range   Rapid Strep A Screen Negative Negative   Assessment & Plan:   1. Sore throat   2. Dysfunction of left eustachian tube   3. Chronic maxillary sinusitis   4. Acquired hypothyroidism   5. Class 2 obesity due to excess calories without serious comorbidity with body mass index (BMI) of  37.0 to 37.9 in adult     Orders Placed This Encounter  Procedures  . Culture, Group A Strep    Order Specific Question:   Source    Answer:   sore throat  . CBC with Differential/Platelet  . TSH+T4F+T3Free  . POCT rapid strep A    Meds ordered this encounter  Medications  . methylPREDNISolone acetate (DEPO-MEDROL) injection 120 mg  . amoxicillin-clavulanate (AUGMENTIN) 875-125 MG tablet    Sig: Take 1 tablet by mouth 2 (two) times daily.    Dispense:  20 tablet    Refill:  0  . phentermine (ADIPEX-P) 37.5 MG tablet    Sig: Take 0.5 tablets (18.75 mg total) by mouth daily before breakfast. And 0.5 tab daily before lunch if needed    Dispense:  30 tablet    Refill:  1    I personally performed the services described in this documentation, which was scribed in my presence. The recorded information has been reviewed and considered, and addended by me as needed.   Delman Cheadle, M.D.  Primary Care at [facility] [address] [place], [place] [address] [phone] phone [phone] fax  04/08/18 2:36 PM

## 2018-02-04 LAB — CBC WITH DIFFERENTIAL/PLATELET
Basophils Absolute: 0 10*3/uL (ref 0.0–0.2)
Basos: 1 %
EOS (ABSOLUTE): 0.2 10*3/uL (ref 0.0–0.4)
EOS: 4 %
HEMATOCRIT: 39.5 % (ref 34.0–46.6)
Hemoglobin: 13.8 g/dL (ref 11.1–15.9)
Immature Grans (Abs): 0 10*3/uL (ref 0.0–0.1)
Immature Granulocytes: 0 %
LYMPHS ABS: 1.4 10*3/uL (ref 0.7–3.1)
Lymphs: 24 %
MCH: 31.4 pg (ref 26.6–33.0)
MCHC: 34.9 g/dL (ref 31.5–35.7)
MCV: 90 fL (ref 79–97)
MONOS ABS: 0.4 10*3/uL (ref 0.1–0.9)
Monocytes: 8 %
NEUTROS PCT: 63 %
Neutrophils Absolute: 3.5 10*3/uL (ref 1.4–7.0)
PLATELETS: 257 10*3/uL (ref 150–450)
RBC: 4.4 x10E6/uL (ref 3.77–5.28)
RDW: 13.6 % (ref 12.3–15.4)
WBC: 5.6 10*3/uL (ref 3.4–10.8)

## 2018-02-04 LAB — TSH+T4F+T3FREE
Free T4: 1.49 ng/dL (ref 0.82–1.77)
T3 FREE: 3 pg/mL (ref 2.0–4.4)
TSH: 2.66 u[IU]/mL (ref 0.450–4.500)

## 2018-02-05 LAB — CULTURE, GROUP A STREP

## 2018-02-12 DIAGNOSIS — F319 Bipolar disorder, unspecified: Secondary | ICD-10-CM | POA: Diagnosis not present

## 2018-02-21 ENCOUNTER — Other Ambulatory Visit: Payer: Self-pay | Admitting: Family Medicine

## 2018-03-06 DIAGNOSIS — F319 Bipolar disorder, unspecified: Secondary | ICD-10-CM | POA: Diagnosis not present

## 2018-03-12 ENCOUNTER — Ambulatory Visit: Payer: BLUE CROSS/BLUE SHIELD | Admitting: Family Medicine

## 2018-03-30 ENCOUNTER — Other Ambulatory Visit: Payer: Self-pay | Admitting: Family Medicine

## 2018-03-31 NOTE — Telephone Encounter (Signed)
Phentermine (Adipex-P) refill Last Refill:02/03/18 #30 Last OV: 02/03/18 PCP: Dr. Brigitte Pulse

## 2018-04-06 ENCOUNTER — Ambulatory Visit: Payer: BLUE CROSS/BLUE SHIELD | Admitting: Family Medicine

## 2018-04-20 ENCOUNTER — Encounter: Payer: Self-pay | Admitting: Family Medicine

## 2018-04-20 ENCOUNTER — Other Ambulatory Visit: Payer: Self-pay

## 2018-04-20 ENCOUNTER — Ambulatory Visit (INDEPENDENT_AMBULATORY_CARE_PROVIDER_SITE_OTHER): Payer: BLUE CROSS/BLUE SHIELD | Admitting: Family Medicine

## 2018-04-20 VITALS — BP 128/72 | HR 99 | Temp 98.0°F | Resp 16 | Ht 62.01 in | Wt 168.0 lb

## 2018-04-20 DIAGNOSIS — K5903 Drug induced constipation: Secondary | ICD-10-CM

## 2018-04-20 DIAGNOSIS — E6609 Other obesity due to excess calories: Secondary | ICD-10-CM | POA: Diagnosis not present

## 2018-04-20 DIAGNOSIS — Z6830 Body mass index (BMI) 30.0-30.9, adult: Secondary | ICD-10-CM

## 2018-04-20 DIAGNOSIS — E039 Hypothyroidism, unspecified: Secondary | ICD-10-CM

## 2018-04-20 DIAGNOSIS — F3342 Major depressive disorder, recurrent, in full remission: Secondary | ICD-10-CM | POA: Diagnosis not present

## 2018-04-20 MED ORDER — LEVOTHYROXINE SODIUM 75 MCG PO TABS: 75.0000 ug | ORAL_TABLET | Freq: Every day | ORAL | 1 refills | Status: DC

## 2018-04-20 MED ORDER — PHENTERMINE HCL 37.5 MG PO TABS: 37.5000 mg | ORAL_TABLET | Freq: Every day | ORAL | 2 refills | Status: DC

## 2018-04-20 NOTE — Progress Notes (Signed)
Subjective:    Patient ID: Selena Chapman, female    DOB: [birth year], 47 y.o.   MRN: [id number] Chief Complaint  Patient presents with  . Depression  . Hypothyroidism    HPI   phenteramine causing severe constipation. Having to take laxatives a couple of times - try eating prunes - feeling con constipation - of not having a BM sev x/d is awful feeling- usually an every day person.   However, she is finally seeing more results on the weight loss so it is totally worth it and she will cont to put up with the constipation.  Past Medical History:  Diagnosis Date  . Allergy   . Asthma   . Depression    Past Surgical History:  Procedure Laterality Date  . APPENDECTOMY     Current Outpatient Medications on File Prior to Visit  Medication Sig Dispense Refill  . albuterol (PROAIR HFA) 108 (90 Base) MCG/ACT inhaler INHALE 2 PUFFS INTO THE LUNGS EVERY 4 HOURS AS NEEDED 8.5 g 3  . carbamazepine (TEGRETOL) 200 MG tablet Take 200 mg by mouth daily.     Marland Kitchen lamoTRIgine (LAMICTAL) 200 MG tablet Take 400 mg by mouth at bedtime.  12  . clonazePAM (KLONOPIN) 1 MG tablet Take 1 tablet (1 mg total) by mouth 3 (three) times daily as needed for anxiety. for anxiety (Patient taking differently: Take 1 mg by mouth as needed for anxiety. for anxiety) 90 tablet 1   No current facility-administered medications on file prior to visit.    No Known Allergies Family History  Problem Relation Age of Onset  . Hypertension Mother   . Depression Sister    Social History   Socioeconomic History  . Marital status: Divorced    Spouse name: [name]  . Number of children: 1  . Years of education: Not on file  . Highest education level: Not on file  Occupational History  . Not on file  Social Needs  . Financial resource strain: Not on file  . Food insecurity:    Worry: Not on file    Inability: Not on file  . Transportation needs:    Medical: Not on file    Non-medical: Not on file  Tobacco Use    . Smoking status: Never Smoker  . Smokeless tobacco: Never Used  Substance and Sexual Activity  . Alcohol use: No  . Drug use: No  . Sexual activity: Not Currently    Birth control/protection: Condom, Pill, Abstinence  Lifestyle  . Physical activity:    Days per week: Not on file    Minutes per session: Not on file  . Stress: Not on file  Relationships  . Social connections:    Talks on phone: Not on file    Gets together: Not on file    Attends religious service: Not on file    Active member of club or organization: Not on file    Attends meetings of clubs or organizations: Not on file    Relationship status: Not on file  Other Topics Concern  . Not on file  Social History Narrative  . Not on file   Depression screen [facility] 2/9 04/20/2018 02/03/2018 12/29/2017 10/10/2017 03/06/2017  Decreased Interest 0 0 0 0 0  Down, Depressed, Hopeless 0 0 0 0 0  PHQ - 2 Score 0 0 0 0 0     Review of Systems See hpi    Objective:   Physical Exam  Constitutional: She is oriented  to person, place, and time. She appears well-developed and well-nourished. No distress.  HENT:  Head: Normocephalic and atraumatic.  Right Ear: External ear normal.  Eyes: Conjunctivae are normal. No scleral icterus.  Pulmonary/Chest: Effort normal.  Neurological: She is alert and oriented to person, place, and time.  Skin: Skin is warm and dry. She is not diaphoretic. No erythema.  Psychiatric: She has a normal mood and affect. Her behavior is normal.      BP 128/72   Pulse 99   Temp 98 F (36.7 C) (Oral)   Resp 16   Ht 5' 2.01" (1.575 m)   Wt 168 lb (76.2 kg)   SpO2 100%   BMI 30.72 kg/m   Assessment & Plan:   1. Acquired hypothyroidism   2. Class 1 obesity due to excess calories without serious comorbidity with body mass index (BMI) of 30.0 to 30.9 in adult   3. Drug-induced constipation     Orders Placed This Encounter  Procedures  . TSH+T4F+T3Free    Meds ordered this encounter   Medications  . phentermine (ADIPEX-P) 37.5 MG tablet    Sig: Take 1 tablet (37.5 mg total) by mouth daily before breakfast.    Dispense:  30 tablet    Refill:  2  . levothyroxine (SYNTHROID, LEVOTHROID) 75 MCG tablet    Sig: Take 1 tablet (75 mcg total) by mouth daily before breakfast.    Dispense:  90 tablet    Refill:  1    Delman Cheadle, M.D.  Primary Care at [facility] [address] [place], [place] [address] [phone] phone [phone] fax  04/20/18 4:39 PM

## 2018-04-20 NOTE — Patient Instructions (Addendum)
If you have lab work done today you will be contacted with your lab results within the next 2 weeks.  If you have not heard from [place] then please contact us. The fastest way to get your results is to register for My Chart.   IF you received an x-ray today, you will receive an invoice from [facility]. Please contact [facility] at [phone] with questions or concerns regarding your invoice.   IF you received labwork today, you will receive an invoice from [place]. Please contact [facility] at [phone] with questions or concerns regarding your invoice.   Our billing staff will not be able to assist you with questions regarding bills from these companies.  You will be contacted with the lab results as soon as they are available. The fastest way to get your results is to activate your My Chart account. Instructions are located on the last page of this paperwork. If you have not heard from [place] regarding the results in 2 weeks, please contact this office.    Signs to look out for if we were OVER-REPLACING your thyroid. Hyperthyroidism Hyperthyroidism is when the thyroid is too active (overactive). Your thyroid is a large gland that is located in your neck. The thyroid helps to control how your body uses food (metabolism). When your thyroid is overactive, it produces too much of a hormone called thyroxine. What are the causes? Causes of hyperthyroidism may include:  Graves disease. This is when your immune system attacks the thyroid gland. This is the most common cause.  Inflammation of the thyroid gland.  Tumor in the thyroid gland or somewhere else.  Excessive use of thyroid medicines, including: ? Prescription thyroid supplement. ? Herbal supplements that mimic thyroid hormones.  Solid or fluid-filled lumps within your thyroid gland (thyroid nodules).  Excessive ingestion of iodine.  What increases the risk?  Being female.  Having a family history of  thyroid conditions. What are the signs or symptoms? Signs and symptoms of hyperthyroidism may include:  Nervousness.  Inability to tolerate heat.  Unexplained weight loss.  Diarrhea.  Change in the texture of hair or skin.  Heart skipping beats or making extra beats.  Rapid heart rate.  Loss of menstruation.  Shaky hands.  Fatigue.  Restlessness.  Increased appetite.  Sleep problems.  Enlarged thyroid gland or nodules.  How is this diagnosed? Diagnosis of hyperthyroidism may include:  Medical history and physical exam.  Blood tests.  Ultrasound tests.  How is this treated? Treatment may include:  Medicines to control your thyroid.  Surgery to remove your thyroid.  Radiation therapy.  Follow these instructions at home:  Take medicines only as directed by your health care provider.  Do not use any tobacco products, including cigarettes, chewing tobacco, or electronic cigarettes. If you need help quitting, ask your health care provider.  Do not exercise or do physical activity until your health care provider approves.  Keep all follow-up appointments as directed by your health care provider. This is important. Contact a health care provider if:  Your symptoms do not get better with treatment.  You have fever.  You are taking thyroid replacement medicine and you: ? Have depression. ? Feel mentally and physically slow. ? Have weight gain. Get help right away if:  You have decreased alertness or a change in your awareness.  You have abdominal pain.  You feel dizzy.  You have a rapid heartbeat.  You have an irregular heartbeat. This information is not  intended to replace advice given to you by your health care provider. Make sure you discuss any questions you have with your health care provider. Document Released: 08/12/2005 Document Revised: 01/11/2016 Document Reviewed: 12/28/2013 Elsevier Interactive Patient Education  2018 [employer].

## 2018-04-21 LAB — TSH+T4F+T3FREE
Free T4: 1.53 ng/dL (ref 0.82–1.77)
T3, Free: 2.7 pg/mL (ref 2.0–4.4)
TSH: 1.97 u[IU]/mL (ref 0.450–4.500)

## 2018-04-26 ENCOUNTER — Other Ambulatory Visit: Payer: Self-pay | Admitting: Family Medicine

## 2018-04-27 ENCOUNTER — Encounter: Payer: Self-pay | Admitting: Family Medicine

## 2018-04-27 MED ORDER — NORETHIN ACE-ETH ESTRAD-FE 1.5-30 MG-MCG PO TABS: 1.0000 | ORAL_TABLET | Freq: Every day | ORAL | 3 refills | Status: DC

## 2018-04-27 NOTE — Telephone Encounter (Signed)
Pt has appt in 2 mos for CPE - is due for pap so will do then.

## 2018-05-01 DIAGNOSIS — F319 Bipolar disorder, unspecified: Secondary | ICD-10-CM | POA: Diagnosis not present

## 2018-05-18 ENCOUNTER — Other Ambulatory Visit: Payer: Self-pay | Admitting: Family Medicine

## 2018-05-18 NOTE — Telephone Encounter (Signed)
ProAir inhaler refill Last refill:03/06/17 3 refill (expired) Last OV:04/20/18 EHU:[id number] Pharmacy: [id number] [place], [place] - [place] [phone] (Phone) [phone] (Fax)

## 2018-05-20 DIAGNOSIS — F3176 Bipolar disorder, in full remission, most recent episode depressed: Secondary | ICD-10-CM | POA: Diagnosis not present

## 2018-05-20 DIAGNOSIS — F3111 Bipolar disorder, current episode manic without psychotic features, mild: Secondary | ICD-10-CM | POA: Diagnosis not present

## 2018-05-29 DIAGNOSIS — F319 Bipolar disorder, unspecified: Secondary | ICD-10-CM | POA: Diagnosis not present

## 2018-07-03 DIAGNOSIS — F319 Bipolar disorder, unspecified: Secondary | ICD-10-CM | POA: Diagnosis not present

## 2018-07-16 ENCOUNTER — Ambulatory Visit (INDEPENDENT_AMBULATORY_CARE_PROVIDER_SITE_OTHER): Payer: BLUE CROSS/BLUE SHIELD | Admitting: Family Medicine

## 2018-07-16 ENCOUNTER — Encounter: Payer: Self-pay | Admitting: Family Medicine

## 2018-07-16 ENCOUNTER — Other Ambulatory Visit: Payer: Self-pay

## 2018-07-16 VITALS — BP 115/79 | HR 76 | Temp 98.0°F | Resp 16 | Ht 62.01 in | Wt 159.0 lb

## 2018-07-16 DIAGNOSIS — B977 Papillomavirus as the cause of diseases classified elsewhere: Secondary | ICD-10-CM

## 2018-07-16 DIAGNOSIS — Z1383 Encounter for screening for respiratory disorder NEC: Secondary | ICD-10-CM

## 2018-07-16 DIAGNOSIS — Z1389 Encounter for screening for other disorder: Secondary | ICD-10-CM

## 2018-07-16 DIAGNOSIS — Z8639 Personal history of other endocrine, nutritional and metabolic disease: Secondary | ICD-10-CM

## 2018-07-16 DIAGNOSIS — R5382 Chronic fatigue, unspecified: Secondary | ICD-10-CM | POA: Diagnosis not present

## 2018-07-16 DIAGNOSIS — Z0000 Encounter for general adult medical examination without abnormal findings: Secondary | ICD-10-CM

## 2018-07-16 DIAGNOSIS — Z130 Encounter for screening for diseases of the blood and blood-forming organs and certain disorders involving the immune mechanism: Secondary | ICD-10-CM

## 2018-07-16 DIAGNOSIS — Z0001 Encounter for general adult medical examination with abnormal findings: Secondary | ICD-10-CM

## 2018-07-16 DIAGNOSIS — Z1231 Encounter for screening mammogram for malignant neoplasm of breast: Secondary | ICD-10-CM

## 2018-07-16 DIAGNOSIS — J029 Acute pharyngitis, unspecified: Secondary | ICD-10-CM | POA: Diagnosis not present

## 2018-07-16 DIAGNOSIS — E039 Hypothyroidism, unspecified: Secondary | ICD-10-CM | POA: Diagnosis not present

## 2018-07-16 DIAGNOSIS — Z113 Encounter for screening for infections with a predominantly sexual mode of transmission: Secondary | ICD-10-CM | POA: Diagnosis not present

## 2018-07-16 DIAGNOSIS — R438 Other disturbances of smell and taste: Secondary | ICD-10-CM | POA: Diagnosis not present

## 2018-07-16 DIAGNOSIS — Z124 Encounter for screening for malignant neoplasm of cervix: Secondary | ICD-10-CM

## 2018-07-16 DIAGNOSIS — Z136 Encounter for screening for cardiovascular disorders: Secondary | ICD-10-CM

## 2018-07-16 DIAGNOSIS — E7800 Pure hypercholesterolemia, unspecified: Secondary | ICD-10-CM | POA: Diagnosis not present

## 2018-07-16 LAB — POCT WET + KOH PREP
Trich by wet prep: ABSENT
YEAST BY KOH: ABSENT
YEAST BY WET PREP: ABSENT

## 2018-07-16 LAB — POCT URINALYSIS DIP (MANUAL ENTRY)
BILIRUBIN UA: NEGATIVE
BILIRUBIN UA: NEGATIVE mg/dL
Glucose, UA: NEGATIVE mg/dL
Leukocytes, UA: NEGATIVE
Nitrite, UA: NEGATIVE
PH UA: 5.5 (ref 5.0–8.0)
Protein Ur, POC: NEGATIVE mg/dL
RBC UA: NEGATIVE
SPEC GRAV UA: 1.02 (ref 1.010–1.025)
Urobilinogen, UA: 1 E.U./dL

## 2018-07-16 MED ORDER — PHENTERMINE HCL 37.5 MG PO TABS: 37.5000 mg | ORAL_TABLET | Freq: Every day | ORAL | 2 refills | Status: DC

## 2018-07-16 MED ORDER — NORETHIN ACE-ETH ESTRAD-FE 1.5-30 MG-MCG PO TABS: 1.0000 | ORAL_TABLET | Freq: Every day | ORAL | 3 refills | Status: DC

## 2018-07-16 NOTE — Progress Notes (Signed)
Subjective:    Patient ID: Selena [place]; female   DOB: 31-Jan-1971; 47 y.o.   MRN: [id number]  Chief Complaint  Patient presents with  . Annual Exam    HPI Primary Preventative Screenings: Cervical Cancer:  Family Planning: STI screening: Breast Cancer: Colorectal Cancer: Tobacco use/EtOH/substances: Bone Density: Cardiac: Weight/Blood sugar/Diet/Exercise: BMI Readings from Last 3 Encounters:  07/16/18 29.07 kg/m  04/20/18 30.72 kg/m  02/03/18 34.35 kg/m   Lab Results  Component Value Date   HGBA1C 4.9 03/06/2017   OTC/Vit/Supp/Herbal: Dentist/Optho: Immunizations:  Immunization History  Administered Date(s) Administered  . Influenza Whole 09/23/2011  . Influenza,inj,Quad PF,6+ Mos 05/20/2013, 08/05/2014, 09/14/2015, 09/23/2017  . Influenza-Unspecified 07/30/2016, 09/23/2017  . Pneumococcal Polysaccharide-23 09/22/2008  . Tdap 09/22/2005, 12/16/2011     Chronic Medical Conditions: Subclinical hypothyroidism: on levothyroxine 75 which was increased from 50 on 5/6 - 6 mos ago  Vit D def - she has stopped taking doesn't remember when or why.  occ taking mvi.  Lap band - could not afford as $6500 so has last 30 lbs this yr through common sense and getting exercise around the house. phenteramine 37.5 qamcausing severe constipation. Having to take laxatives a couple of times - try eating prunes - feeling con constipation - of not having a BM sev x/d is awful feeling- usually an every day person.   However, she is finally seeing more results on the weight loss so it is totally worth it and she will cont to put up with the constipation.  Still taking the phenteramine but much less - cycling it.  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 Donzetta Sprung practice - added on a little sertraline. Had to change to Dr. Toy Care?? due to insurance ear;oer tjos uear  Doing well on  OCPs.  Has not needed to use inhin over a yr No drinking and no tob. Rare hemorrhoid.  Sev wks of HA, falling asleep while wathcing tiv, cold, nail ridges.  Did 5K sev wks ago which was miserable but then after horribel allergy attack.  Then for sev day after just copious sinus sxs and going down throat.  Added some flonase which could be helping some. COuld be that not eating enough. Hard to as the weather is bed.  Does get a little seasonal affective d/o some as well.   Medical History: Past Medical History:  Diagnosis Date  . Allergy   . Asthma   . Depression    Past Surgical History:  Procedure Laterality Date  . APPENDECTOMY     Current Outpatient Medications on File Prior to Visit  Medication Sig Dispense Refill  . carbamazepine (TEGRETOL) 200 MG tablet Take 200 mg by mouth daily.     . clonazePAM (KLONOPIN) 1 MG tablet TK 1 T PO TID PRN  1  . fexofenadine (ALLEGRA ALLERGY) 180 MG tablet     . lamoTRIgine (LAMICTAL) 200 MG tablet Take 400 mg by mouth at bedtime.  12  . levothyroxine (SYNTHROID, LEVOTHROID) 75 MCG tablet Take 1 tablet (75 mcg total) by mouth daily before breakfast. 90 tablet 1  . norethindrone-ethinyl estradiol-iron (BLISOVI FE 1.5/30) 1.5-30 MG-MCG tablet Take 1 tablet by mouth daily. Skipping the placebo pills. Start new pack after 21d. 4 Package 3  . phentermine (ADIPEX-P) 37.5 MG tablet Take 1 tablet (37.5 mg total) by mouth daily before breakfast. 30 tablet 2  . PROAIR  HFA 108 (90 Base) MCG/ACT inhaler INHALE TWO PUFFS BY MOUTH EVERY 4 HOURS AS NEEDED 8.5 g 0  . clonazePAM (KLONOPIN) 1 MG tablet Take 1 tablet (1 mg total) by mouth 3 (three) times daily as needed for anxiety. for anxiety (Patient taking differently: Take 1 mg by mouth as needed for anxiety. for anxiety) 90 tablet 1   No current facility-administered medications on file prior to visit.    No Known Allergies Family History  Problem Relation Age of Onset  . Hypertension Mother   .  Depression Sister    Social History   Socioeconomic History  . Marital status: Divorced    Spouse name: [name]  . Number of children: 1  . Years of education: Not on file  . Highest education level: Not on file  Occupational History  . Not on file  Social Needs  . Financial resource strain: Not on file  . Food insecurity:    Worry: Not on file    Inability: Not on file  . Transportation needs:    Medical: Not on file    Non-medical: Not on file  Tobacco Use  . Smoking status: Never Smoker  . Smokeless tobacco: Never Used  Substance and Sexual Activity  . Alcohol use: No  . Drug use: No  . Sexual activity: Not Currently    Birth control/protection: Condom, Pill, Abstinence  Lifestyle  . Physical activity:    Days per week: Not on file    Minutes per session: Not on file  . Stress: Not on file  Relationships  . Social connections:    Talks on phone: Not on file    Gets together: Not on file    Attends religious service: Not on file    Active member of club or organization: Not on file    Attends meetings of clubs or organizations: Not on file    Relationship status: Not on file  Other Topics Concern  . Not on file  Social History Narrative  . Not on file   Depression screen [facility] 2/9 04/20/2018 02/03/2018 12/29/2017 10/10/2017 03/06/2017  Decreased Interest 0 0 0 0 0  Down, Depressed, Hopeless 0 0 0 0 0  PHQ - 2 Score 0 0 0 0 0     ROS Otherwise as noted in HPI.  Objective:  BP 115/79   Pulse 76   Temp 98 F (36.7 C) (Oral)   Resp 16   Ht 5' 2.01" (1.575 m)   Wt 159 lb (72.1 kg)   SpO2 100%   BMI 29.07 kg/m   Visual Acuity Screening   Right eye Left eye Both eyes  Without correction:     With correction: 20/20 20/20 20/20    Physical Exam  Constitutional: She is oriented to person, place, and time. She appears well-developed and well-nourished. No distress.  HENT:  Head: Normocephalic and atraumatic.  Right Ear: Tympanic membrane, external ear  and ear canal normal.  Left Ear: Tympanic membrane, external ear and ear canal normal.  Nose: Mucosal edema present. No rhinorrhea.  Mouth/Throat: Uvula is midline, oropharynx is clear and moist and mucous membranes are normal. No posterior oropharyngeal erythema.  Eyes: Pupils are equal, round, and reactive to light. Conjunctivae and EOM are normal. Right eye exhibits no discharge. Left eye exhibits no discharge. No scleral icterus.  Neck: Normal range of motion. Neck supple. No thyromegaly present.  Cardiovascular: Normal rate, regular rhythm, normal heart sounds and intact distal pulses.  Pulmonary/Chest: Effort normal  and breath sounds normal. No respiratory distress. No breast tenderness, discharge or bleeding.  Abdominal: Soft. Bowel sounds are normal. There is no tenderness.  Genitourinary: Vagina normal and uterus normal. No breast tenderness, discharge or bleeding. Cervix exhibits no motion tenderness and no friability. Right adnexum displays no mass and no tenderness. Left adnexum displays no mass and no tenderness.  Musculoskeletal: She exhibits no edema.  Lymphadenopathy:    She has no cervical adenopathy.  Neurological: She is alert and oriented to person, place, and time. She has normal reflexes.  Skin: Skin is warm and dry. She is not diaphoretic. No erythema.  Psychiatric: She has a normal mood and affect. Her behavior is normal.   [facility] Office Visit on 07/16/2018  Component Date Value Ref Range Status  . Color, UA 07/16/2018 yellow  yellow Final  . Clarity, UA 07/16/2018 clear  clear Final  . Glucose, UA 07/16/2018 negative  negative mg/dL Final  . Bilirubin, UA 07/16/2018 negative  negative Final  . Ketones, POC UA 07/16/2018 negative  negative mg/dL Final  . Spec Grav, UA 07/16/2018 1.020  1.010 - 1.025 Final  . Blood, UA 07/16/2018 negative  negative Final  . pH, UA 07/16/2018 5.5  5.0 - 8.0 Final  . Protein Ur, POC 07/16/2018 negative  negative mg/dL Final  .  Urobilinogen, UA 07/16/2018 1.0  0.2 or 1.0 E.U./dL Final  . Nitrite, UA 07/16/2018 Negative  Negative Final  . Leukocytes, UA 07/16/2018 Negative  Negative Final  . EBV VCA IgM 07/16/2018 <36.00  U/mL Final   Comment:       U/mL              Interpretation       ----              --------------       <36.00            Negative       36.00-43.99       Equivocal       >43.99            Positive   . EBV VCA IgG 07/16/2018 192.00* U/mL Final   Comment:        U/mL             Interpretation        ----             --------------        <18.00           Negative        18.00-21.99      Equivocal        >21.99           Positive   . EBV NA IgG 07/16/2018 259.00* U/mL Final   Comment:        U/mL             Interpretation        ----             --------------        <18.00           Negative        18.00-21.99      Equivocal        >21.99           Positive   . Interpretation 07/16/2018    Final   Comment: . Suggestive of a past Epstein-Barr virus infection. In infants, a  similar pattern may occur as a result of passive maternal transfer of antibody. .   . Vitamin B-12 07/16/2018 445  200 - 1,100 pg/mL Final  . Vit D, 25-Hydroxy 07/16/2018 25* 30 - 100 ng/mL Final   Comment: Vitamin D Status         25-OH Vitamin D: . Deficiency:                    <20 ng/mL Insufficiency:             20 - 29 ng/mL Optimal:                 > or = 30 ng/mL . For 25-OH Vitamin D testing on patients on  D2-supplementation and patients for whom quantitation  of D2 and D3 fractions is required, the QuestAssureD(TM) 25-OH VIT D, (D2,D3), LC/MS/MS is recommended: order  code [phone] (patients >58yr). . For more information on this test, go to: http://education.questdiagnostics.com/faq/FAQ163 (This link is being provided for  informational/educational purposes only.)   . WBC 07/16/2018 4.3  3.8 - 10.8 Thousand/uL Final  . RBC 07/16/2018 4.58  3.80 - 5.10 Million/uL Final  . Hemoglobin 07/16/2018  14.3  11.7 - 15.5 g/dL Final  . HCT 07/16/2018 40.7  35.0 - 45.0 % Final  . MCV 07/16/2018 88.9  80.0 - 100.0 fL Final  . MCH 07/16/2018 31.2  27.0 - 33.0 pg Final  . MCHC 07/16/2018 35.1  32.0 - 36.0 g/dL Final  . RDW 07/16/2018 12.2  11.0 - 15.0 % Final  . Platelets 07/16/2018 229  140 - 400 Thousand/uL Final  . MPV 07/16/2018 9.9  7.5 - 12.5 fL Final  . Neutro Abs 07/16/2018 2,567  1,500 - 7,800 cells/uL Final  . Lymphs Abs 07/16/2018 937  850 - 3,900 cells/uL Final  . WBC mixed population 07/16/2018 503  200 - 950 cells/uL Final  . Eosinophils Absolute 07/16/2018 262  15 - 500 cells/uL Final  . Basophils Absolute 07/16/2018 30  0 - 200 cells/uL Final  . Neutrophils Relative % 07/16/2018 59.7  % Final  . Total Lymphocyte 07/16/2018 21.8  % Final  . Monocytes Relative 07/16/2018 11.7  % Final  . Eosinophils Relative 07/16/2018 6.1  % Final  . Basophils Relative 07/16/2018 0.7  % Final  . Glucose, Bld 07/16/2018 75  65 - 99 mg/dL Final   Comment: .            Fasting reference interval .   . BUN 07/16/2018 8  7 - 25 mg/dL Final  . Creat 07/16/2018 0.76  0.50 - 1.10 mg/dL Final  . BUN/Creatinine Ratio 118/56/3149NOT APPLICABLE  6 - 22 (calc) Final  . Sodium 07/16/2018 135  135 - 146 mmol/L Final  . Potassium 07/16/2018 4.1  3.5 - 5.3 mmol/L Final  . Chloride 07/16/2018 102  98 - 110 mmol/L Final  . CO2 07/16/2018 26  20 - 32 mmol/L Final  . Calcium 07/16/2018 9.1  8.6 - 10.2 mg/dL Final  . Total Protein 07/16/2018 6.2  6.1 - 8.1 g/dL Final  . Albumin 07/16/2018 3.8  3.6 - 5.1 g/dL Final  . Globulin 07/16/2018 2.4  1.9 - 3.7 g/dL (calc) Final  . AG Ratio 07/16/2018 1.6  1.0 - 2.5 (calc) Final  . Total Bilirubin 07/16/2018 0.5  0.2 - 1.2 mg/dL Final  . Alkaline phosphatase (APISO) 07/16/2018 58  33 - 115 U/L Final  . AST 07/16/2018 24  10 - 35 U/L  Final  . ALT 07/16/2018 25  6 - 29 U/L Final  . Cholesterol 07/16/2018 326* <200 mg/dL Final  . HDL 07/16/2018 72  >50 mg/dL Final    . Triglycerides 07/16/2018 77  <150 mg/dL Final  . LDL Cholesterol (Calc) 07/16/2018 236* mg/dL (calc) Final   Comment: LDL-C levels > or = 190 mg/dL may indicate familial  hypercholesterolemia (FH). Clinical assessment and  measurement of blood lipid levels should be  considered for all first degree relatives of  patients with an FH diagnosis.  For questions about testing for familial hypercholesterolemia, please call Insurance risk surveyor at [email or web address]. Duncan Dull, et al. J National Lipid Association  Recommendations for Patient-Centered Management of  Dyslipidemia: Part 1 Journal of Clinical Lipidology  2015;9(2), 129-169. Reference range: <100 . Desirable range <100 mg/dL for primary prevention;   <70 mg/dL for patients with CHD or diabetic patients  with > or = 2 CHD risk factors. Marland Kitchen LDL-C is now calculated using the Martin-Hopkins  calculation, which is a validated novel method providing  better accuracy than the Friedewald equation in the  estimation of LDL-C.  Cresenciano Genre et al. Annamaria Helling. 8115;726(20): 2061-2068  (http://education.Quest[facility].com/faq/FAQ164)   . Total CHOL/HDL Ratio 07/16/2018 4.5  <5.0 (calc) Final  . Non-HDL Cholesterol (Calc) 07/16/2018 254* <130 mg/dL (calc) Final   Comment: Non-HDL level > or = 220 is very high and may indicate  genetic familial hypercholesterolemia ([place]). Clinical  assessment and measurement of blood lipid levels  should be considered for all first-degree relatives  of patients with an FH diagnosis. . For patients with diabetes plus 1 major ASCVD risk  factor, treating to a non-HDL-C goal of <100 mg/dL  (LDL-C of <70 mg/dL) is considered a therapeutic  option.   . Clinical Information: 07/16/2018    Final   None given  . LMP: 07/16/2018    Final   NONE GIVEN  . PREV. PAP: 07/16/2018    Final   NONE GIVEN  . PREV. BX: 07/16/2018    Final   NONE GIVEN  . HPV DNA Probe-Source 07/16/2018    Final   Cervix,  Endocervix  . STATEMENT OF ADEQUACY: 07/16/2018    Final   Comment: Satisfactory for evaluation. Endocervical/transformation zone component present. Age and/or menstrual status not provided   . INTERPRETATION/RESULT: 07/16/2018    Final   Negative for intraepithelial lesion or malignancy.  . INFECTION: 07/16/2018    Final   Comment: Shift in vaginal flora suggestive of bacterial vaginosis.   . Comment: 07/16/2018    Final   Comment: This Pap test has been evaluated with computer assisted technology.   . CYTOTECHNOLOGIST: 07/16/2018    Final   Comment: [facility], CT(ASCP) CT screening location: [address], [place], [place] [address]   . REVIEW CYTOTECHNOLOGIST: 07/16/2018    Final   Comment: [facility], CT(HEW) CT screening location: [address], [place], [place] [address]   . HPV DNA High Risk 07/16/2018 Detected* Not Detect Final   Comment: This test was performed using the APTIMA HPV Assay ([employer] . This assay detects E6/E7 viral messenger RNA (mRNA) from 14 high-risk HPV types (16,18,31,33,35,39,45,51,52,56,58,59,66,68). . The analytical performance characteristics of this assay have been determined by [facility]. The modifications have not been cleared or approved by the FDA. This assay has been validated pursuant to the CLIA regulations and is used for clinical purposes. EXPLANATORY NOTE:  . The Pap is a screening test for cervical cancer. It is  not a diagnostic test and is subject to false negative  and false positive results. It is most reliable when a  satisfactory sample, regularly obtained, is submitted  with relevant clinical findings and history, and when  the Pap result is evaluated along with historic and  current clinical information. .   Sallee Provencal NUMBER: 07/16/2018 [id number]   Final  . SPECIMEN QUALITY: 07/16/2018 Adequate   Final  . SOURCE: 07/16/2018 THROAT   Final  . STATUS: 07/16/2018 FINAL   Final  . RESULT:  07/16/2018 No group A Streptococcus isolated   Final  . Yeast by KOH 07/16/2018 Absent  Absent Final  . Yeast by wet prep 07/16/2018 Absent  Absent Final  . WBC by wet prep 07/16/2018 Many* Few Final  . Clue Cells Wet Prep HPF POC 07/16/2018 Few* None Final  . Trich by wet prep 07/16/2018 Absent  Absent Final  . Bacteria Wet Prep HPF POC 07/16/2018 Few  Few Final  . Epithelial Cells By Wet Pref ([facility]) 07/16/2018 Many* None, Few, Too numerous to count Final  . RBC,UR,HPF,POC 07/16/2018 None  None RBC/hpf Final  . TSH W/REFLEX TO FT4 07/16/2018 1.17  mIU/L Final   Comment:           Reference Range .           > or = 20 Years  0.40-4.50 .                Pregnancy Ranges           First trimester    0.26-2.66           Second trimester   0.55-2.73           Third trimester    0.43-2.91      Assessment & Plan:   1. Annual physical exam   2. Screening for cervical cancer   3. Routine screening for STI (sexually transmitted infection)   4. Acquired hypothyroidism - tsh at goal so cont on levothyroxine 75 mcg qam.  5. Pure hypercholesterolemia   6. Chronic fatigue   7. Screening for deficiency anemia   8. Screening for cardiovascular, respiratory, and genitourinary diseases   9. History of vitamin D deficiency   10. Bitter taste   11. Acute pharyngitis, unspecified etiology   12. Encounter for screening mammogram for breast cancer     Patient will continue on current chronic medications other than changes noted above, so ok to refill when needed.   Reviewed all health maintenance recommendations per USPSTF guidelines.   See after visit summary for patient specific instructions.  Orders Placed This Encounter  Procedures  . Culture, Group A Strep    Order Specific Question:   Source    Answer:   throat  . MM DIGITAL SCREENING BILATERAL    BCBS PF: NONE/  NO NEEDS/  NO IMPLANTS/  NOHX OF BR CA/ FJPWPT    Standing Status:   Future    Standing Expiration Date:   09/16/2019     Order Specific Question:   Reason for Exam (SYMPTOM  OR DIAGNOSIS REQUIRED)    Answer:   ANNUAL    Order Specific Question:   Preferred imaging location?    Answer:   [facility]    Order Specific Question:   Is the patient pregnant?    Answer:   No  . Flu Vaccine QUAD 36+ mos IM  . Epstein-Barr virus VCA antibody panel  . Vitamin B12  . VITAMIN  D 25 Hydroxy (Vit-D Deficiency, Fractures)  . CBC with Differential/Platelet  . Comprehensive metabolic panel    Order Specific Question:   Has the patient fasted?    Answer:   No  . Lipid panel    Order Specific Question:   Has the patient fasted?    Answer:   No  . TSH + free T4  . POCT urinalysis dipstick  . POCT Wet + KOH Prep    Meds ordered this encounter  Medications  . phentermine (ADIPEX-P) 37.5 MG tablet    Sig: Take 1 tablet (37.5 mg total) by mouth daily before breakfast.    Dispense:  30 tablet    Refill:  2  . norethindrone-ethinyl estradiol-iron (BLISOVI FE 1.5/30) 1.5-30 MG-MCG tablet    Sig: Take 1 tablet by mouth daily. Skipping the placebo pills. Start new pack after 21d.    Dispense:  4 Package    Refill:  3    4 packs is required for a 3 mo supply of back-to-back OCP use due to using monophasic pill continuously to eliminate menese  . levothyroxine (SYNTHROID, LEVOTHROID) 75 MCG tablet    Sig: Take 1 tablet (75 mcg total) by mouth daily before breakfast.    Dispense:  90 tablet    Refill:  1    Patient verbalized to me that they understand the following: diagnosis, what is being done for them, what to expect and what should be done at home.  Their questions have been answered. They understand that I am unable to predict every possible medication interaction or adverse outcome and that if any unexpected symptoms arise, they should contact us and their pharmacist, as well as never hesitate to seek urgent/emergent care at [facility] or ER if they think it might be warranted.    Delman Cheadle, MD, MPH [facility] [address] [place], [place]  [address] [phone] Office phone  [phone] Office fax   07/16/18 10:24 AM

## 2018-07-16 NOTE — Patient Instructions (Addendum)
If you have lab work done today you will be contacted with your lab results within the next 2 weeks.  If you have not heard from [place] then please contact us. The fastest way to get your results is to register for My Chart.   IF you received an x-ray today, you will receive an invoice from [facility]. Please contact [facility] at [phone] with questions or concerns regarding your invoice.   IF you received labwork today, you will receive an invoice from [facility]. Please contact [facility] at [phone] with questions or concerns regarding your invoice.   Our billing staff will not be able to assist you with questions regarding bills from these companies.  You will be contacted with the lab results as soon as they are available. The fastest way to get your results is to activate your My Chart account. Instructions are located on the last page of this paperwork. If you have not heard from [place] regarding the results in 2 weeks, please contact this office.    Health Maintenance, Female Adopting a healthy lifestyle and getting preventive care can go a long way to promote health and wellness. Talk with your health care provider about what schedule of regular examinations is right for you. This is a good chance for you to check in with your provider about disease prevention and staying healthy. In between checkups, there are plenty of things you can do on your own. Experts have done a lot of research about which lifestyle changes and preventive measures are most likely to keep you healthy. Ask your health care provider for more information. Weight and diet Eat a healthy diet  Be sure to include plenty of vegetables, fruits, low-fat dairy products, and lean protein.  Do not eat a lot of foods high in solid fats, added sugars, or salt.  Get regular exercise. This is one of the most important things you can do for your health. ? Most adults should exercise for at least 150  minutes each week. The exercise should increase your heart rate and make you sweat (moderate-intensity exercise). ? Most adults should also do strengthening exercises at least twice a week. This is in addition to the moderate-intensity exercise.  Maintain a healthy weight  Body mass index (BMI) is a measurement that can be used to identify possible weight problems. It estimates body fat based on height and weight. Your health care provider can help determine your BMI and help you achieve or maintain a healthy weight.  For females 90 years of age and older: ? A BMI below 18.5 is considered underweight. ? A BMI of 18.5 to 24.9 is normal. ? A BMI of 25 to 29.9 is considered overweight. ? A BMI of 30 and above is considered obese.  Watch levels of cholesterol and blood lipids  You should start having your blood tested for lipids and cholesterol at 45 years of age, then have this test every 5 years.  You may need to have your cholesterol levels checked more often if: ? Your lipid or cholesterol levels are high. ? You are older than 45 years of age. ? You are at high risk for heart disease.  Cancer screening Lung Cancer  Lung cancer screening is recommended for adults 69-37 years old who are at high risk for lung cancer because of a history of smoking.  A yearly low-dose CT scan of the lungs is recommended for people who: ? Currently smoke. ? Have quit within  the past 15 years. ? Have at least a 30-pack-year history of smoking. A pack year is smoking an average of one pack of cigarettes a day for 1 year.  Yearly screening should continue until it has been 15 years since you quit.  Yearly screening should stop if you develop a health problem that would prevent you from having lung cancer treatment.  Breast Cancer  Practice breast self-awareness. This means understanding how your breasts normally appear and feel.  It also means doing regular breast self-exams. Let your health care  provider know about any changes, no matter how small.  If you are in your 20s or 30s, you should have a clinical breast exam (CBE) by a health care provider every 1-3 years as part of a regular health exam.  If you are 40 or older, have a CBE every year. Also consider having a breast X-ray (mammogram) every year.  If you have a family history of breast cancer, talk to your health care provider about genetic screening.  If you are at high risk for breast cancer, talk to your health care provider about having an MRI and a mammogram every year.  Breast cancer gene (BRCA) assessment is recommended for women who have family members with BRCA-related cancers. BRCA-related cancers include: ? Breast. ? Ovarian. ? Tubal. ? Peritoneal cancers.  Results of the assessment will determine the need for genetic counseling and BRCA1 and BRCA2 testing.  Cervical Cancer Your health care provider may recommend that you be screened regularly for cancer of the pelvic organs (ovaries, uterus, and vagina). This screening involves a pelvic examination, including checking for microscopic changes to the surface of your cervix (Pap test). You may be encouraged to have this screening done every 3 years, beginning at age 21.  For women ages 30-65, health care providers may recommend pelvic exams and Pap testing every 3 years, or they may recommend the Pap and pelvic exam, combined with testing for human papilloma virus (HPV), every 5 years. Some types of HPV increase your risk of cervical cancer. Testing for HPV may also be done on women of any age with unclear Pap test results.  Other health care providers may not recommend any screening for nonpregnant women who are considered low risk for pelvic cancer and who do not have symptoms. Ask your health care provider if a screening pelvic exam is right for you.  If you have had past treatment for cervical cancer or a condition that could lead to cancer, you need Pap tests  and screening for cancer for at least 20 years after your treatment. If Pap tests have been discontinued, your risk factors (such as having a new sexual partner) need to be reassessed to determine if screening should resume. Some women have medical problems that increase the chance of getting cervical cancer. In these cases, your health care provider may recommend more frequent screening and Pap tests.  Colorectal Cancer  This type of cancer can be detected and often prevented.  Routine colorectal cancer screening usually begins at 45 years of age and continues through 45 years of age.  Your health care provider may recommend screening at an earlier age if you have risk factors for colon cancer.  Your health care provider may also recommend using home test kits to check for hidden blood in the stool.  A small camera at the end of a tube can be used to examine your colon directly (sigmoidoscopy or colonoscopy). This is done to check for   the earliest forms of colorectal cancer.  Routine screening usually begins at age 50.  Direct examination of the colon should be repeated every 5-10 years through 45 years of age. However, you may need to be screened more often if early forms of precancerous polyps or small growths are found.  Skin Cancer  Check your skin from head to toe regularly.  Tell your health care provider about any new moles or changes in moles, especially if there is a change in a mole's shape or color.  Also tell your health care provider if you have a mole that is larger than the size of a pencil eraser.  Always use sunscreen. Apply sunscreen liberally and repeatedly throughout the day.  Protect yourself by wearing long sleeves, pants, a wide-brimmed hat, and sunglasses whenever you are outside.  Heart disease, diabetes, and high blood pressure  High blood pressure causes heart disease and increases the risk of stroke. High blood pressure is more likely to develop  in: ? People who have blood pressure in the high end of the normal range (130-139/85-89 mm Hg). ? People who are overweight or obese. ? People who are African American.  If you are 18-39 years of age, have your blood pressure checked every 3-5 years. If you are 40 years of age or older, have your blood pressure checked every year. You should have your blood pressure measured twice-once when you are at a hospital or clinic, and once when you are not at a hospital or clinic. Record the average of the two measurements. To check your blood pressure when you are not at a hospital or clinic, you can use: ? An automated blood pressure machine at a pharmacy. ? A home blood pressure monitor.  If you are between 55 years and 79 years old, ask your health care provider if you should take aspirin to prevent strokes.  Have regular diabetes screenings. This involves taking a blood sample to check your fasting blood sugar level. ? If you are at a normal weight and have a low risk for diabetes, have this test once every three years after 45 years of age. ? If you are overweight and have a high risk for diabetes, consider being tested at a younger age or more often. Preventing infection Hepatitis B  If you have a higher risk for hepatitis B, you should be screened for this virus. You are considered at high risk for hepatitis B if: ? You were born in a country where hepatitis B is common. Ask your health care provider which countries are considered high risk. ? Your parents were born in a high-risk country, and you have not been immunized against hepatitis B (hepatitis B vaccine). ? You have HIV or AIDS. ? You use needles to inject street drugs. ? You live with someone who has hepatitis B. ? You have had sex with someone who has hepatitis B. ? You get hemodialysis treatment. ? You take certain medicines for conditions, including cancer, organ transplantation, and autoimmune conditions.  Hepatitis C  Blood  testing is recommended for: ? Everyone born from 1945 through 1965. ? Anyone with known risk factors for hepatitis C.  Sexually transmitted infections (STIs)  You should be screened for sexually transmitted infections (STIs) including gonorrhea and chlamydia if: ? You are sexually active and are younger than 45 years of age. ? You are older than 45 years of age and your health care provider tells you that you are at risk for this   type of infection. ? Your sexual activity has changed since you were last screened and you are at an increased risk for chlamydia or gonorrhea. Ask your health care provider if you are at risk.  If you do not have HIV, but are at risk, it may be recommended that you take a prescription medicine daily to prevent HIV infection. This is called pre-exposure prophylaxis (PrEP). You are considered at risk if: ? You are sexually active and do not regularly use condoms or know the HIV status of your partner(s). ? You take drugs by injection. ? You are sexually active with a partner who has HIV.  Talk with your health care provider about whether you are at high risk of being infected with HIV. If you choose to begin PrEP, you should first be tested for HIV. You should then be tested every 3 months for as long as you are taking PrEP. Pregnancy  If you are premenopausal and you may become pregnant, ask your health care provider about preconception counseling.  If you may become pregnant, take 400 to 800 micrograms (mcg) of folic acid every day.  If you want to prevent pregnancy, talk to your health care provider about birth control (contraception). Osteoporosis and menopause  Osteoporosis is a disease in which the bones lose minerals and strength with aging. This can result in serious bone fractures. Your risk for osteoporosis can be identified using a bone density scan.  If you are 58 years of age or older, or if you are at risk for osteoporosis and fractures, ask your  health care provider if you should be screened.  Ask your health care provider whether you should take a calcium or vitamin D supplement to lower your risk for osteoporosis.  Menopause may have certain physical symptoms and risks.  Hormone replacement therapy may reduce some of these symptoms and risks. Talk to your health care provider about whether hormone replacement therapy is right for you. Follow these instructions at home:  Schedule regular health, dental, and eye exams.  Stay current with your immunizations.  Do not use any tobacco products including cigarettes, chewing tobacco, or electronic cigarettes.  If you are pregnant, do not drink alcohol.  If you are breastfeeding, limit how much and how often you drink alcohol.  Limit alcohol intake to no more than 1 drink per day for nonpregnant women. One drink equals 12 ounces of beer, 5 ounces of wine, or 1 ounces of hard liquor.  Do not use street drugs.  Do not share needles.  Ask your health care provider for help if you need support or information about quitting drugs.  Tell your health care provider if you often feel depressed.  Tell your health care provider if you have ever been abused or do not feel safe at home. This information is not intended to replace advice given to you by your health care provider. Make sure you discuss any questions you have with your health care provider. Document Released: 02/25/2011 Document Revised: 01/18/2016 Document Reviewed: 05/16/2015 Elsevier Interactive Patient Education  2018 [employer].  Fatigue Fatigue is feeling tired all of the time, a lack of energy, or a lack of motivation. Occasional or mild fatigue is often a normal response to activity or life in general. However, long-lasting (chronic) or extreme fatigue may indicate an underlying medical condition. Follow these instructions at home: Watch your fatigue for any changes. The following actions may help to lessen any  discomfort you are feeling:  Talk  to your health care provider about how much sleep you need each night. Try to get the required amount every night.  Take medicines only as directed by your health care provider.  Eat a healthy and nutritious diet. Ask your health care provider if you need help changing your diet.  Drink enough fluid to keep your urine clear or pale yellow.  Practice ways of relaxing, such as yoga, meditation, massage therapy, or acupuncture.  Exercise regularly.  Change situations that cause you stress. Try to keep your work and personal routine reasonable.  Do not abuse illegal drugs.  Limit alcohol intake to no more than 1 drink per day for nonpregnant women and 2 drinks per day for men. One drink equals 12 ounces of beer, 5 ounces of wine, or 1 ounces of hard liquor.  Take a multivitamin, if directed by your health care provider.  Contact a health care provider if:  Your fatigue does not get better.  You have a fever.  You have unintentional weight loss or gain.  You have headaches.  You have difficulty: ? Falling asleep. ? Sleeping throughout the night.  You feel angry, guilty, anxious, or sad.  You are unable to have a bowel movement (constipation).  You skin is dry.  Your legs or another part of your body is swollen. Get help right away if:  You feel confused.  Your vision is blurry.  You feel faint or pass out.  You have a severe headache.  You have severe abdominal, pelvic, or back pain.  You have chest pain, shortness of breath, or an irregular or fast heartbeat.  You are unable to urinate or you urinate less than normal.  You develop abnormal bleeding, such as bleeding from the rectum, vagina, nose, lungs, or nipples.  You vomit blood.  You have thoughts about harming yourself or committing suicide.  You are worried that you might harm someone else. This information is not intended to replace advice given to you by your  health care provider. Make sure you discuss any questions you have with your health care provider. Document Released: 06/09/2007 Document Revised: 01/18/2016 Document Reviewed: 12/14/2013 Elsevier Interactive Patient Education  2018 [place].  Chronic Fatigue Syndrome Chronic fatigue syndrome (CFS) is a condition that causes extreme tiredness (fatigue). This fatigue does not improve with rest, and it gets worse with physical or mental activity. You may have several other symptoms along with fatigue. Symptoms may come and go, but they generally last for months. Sometimes, CFS gets better over time, but it can be a lifelong condition. There is no cure, but there are many possible treatments. You will need to work with your health care providers to find a treatment plan that works best for you. What are the causes? The cause of CFS is not known. There may be more than one cause. Possible causes include:  An infection.  An abnormal body defense system (immune system).  Low blood pressure.  Poor diet.  Physical or emotional stress.  What increases the risk? You are more likely to develop this condition if:  You are female.  You are 45?45 years old.  You have a family history of CFS.  You live with a lot of emotional stress.  What are the signs or symptoms? The main symptom of CFS is fatigue that is severe enough to interfere with day-to-day activities. This fatigue does not get better with rest, and it gets worse with physical or mental activity. There are eight  other major symptoms of CFS:  Lack of energy (malaise) that lasts more than 24 hours after physical exertion.  Sleep that does not relieve fatigue (unrefreshing sleep).  Short-term memory loss or confusion.  Joint pain without redness or swelling.  Muscle aches.  Headaches.  Painful and swollen glands (lymph nodes) in the neck or under the arms.  Sore throat.  You may also have:  Abdominal cramps,  constipation, or diarrhea (irritable bowel).  Chills.  Night sweats.  Vision changes.  Dizziness.  Mental confusion (brain fog).  Clumsiness.  Sensitivity to food, noise, or odors.  Mood swings, depression, or anxiety attacks.  How is this diagnosed? There are no tests that can diagnose this condition. Your health care provider will make the diagnosis based on your medical history, a physical exam, and a mental health exam. However, it is important to make sure that your symptoms are not caused by another medical condition. You may have lab tests or X-rays to rule out other conditions. For your health care provider to diagnose CFS:  You must have had fatigue for at least 6 straight months.  Fatigue must be your first symptom, and it must be severe enough to interfere with day-to-day activities.  There must be no other cause found for the fatigue.  You must also have at least four of the eight other major symptoms of CFS.  How is this treated? There is no cure for CFS. The condition affects everyone differently. You will need to work with your team of health care providers to find the best treatments for your symptoms. Your team may include your primary care provider, physical and exercise therapists, and mental health therapists. Treatment may include:  Improving sleep with a regular bedtime routine.  Avoiding caffeine, alcohol, and tobacco.  Doing light exercise and stretching during the day.  Taking medicines to help you sleep or to relieve joint or muscle pain.  Learning and practicing relaxation techniques.  Using memory aids or doing brainteasers to improve memory and concentration.  Seeing a mental health therapist to evaluate and treat depression, if necessary.  Trying massage therapy, acupuncture, and movement exercises, such as yoga or tai chi.  Follow these instructions at home:  Activity  Exercise regularly, as told by your health care  provider.  Avoid fatigue by pacing yourself during the day and getting enough sleep at night.  Go to bed and get up at the same time every day. Eating and drinking  Avoid caffeine and alcohol.  Avoid heavy meals in the evening.  Eat a well-balanced diet. General instructions  Take over-the-counter and prescription medicines only as told by your health care provider.  Do not use herbal or dietary supplements unless they are approved by your health care provider.  Maintain a healthy weight.  Avoid stress and use stress-reducing techniques that you learn in therapy.  Do not use any products that contain nicotine or tobacco, such as cigarettes and e-cigarettes. If you need help quitting, ask your health care provider.  Consider joining a CFS support group.  Keep all follow-up visits as told by your health care provider. This is important. Contact a health care provider if:  Your symptoms do not get better or they get worse.  You feel angry, guilty, anxious, or depressed. This information is not intended to replace advice given to you by your health care provider. Make sure you discuss any questions you have with your health care provider. Document Released: 09/19/2004 Document Revised: 04/18/2016 Document  Reviewed: 11/20/2015 Elsevier Interactive Patient Education  [employer].

## 2018-07-17 LAB — EPSTEIN-BARR VIRUS VCA ANTIBODY PANEL
EBV NA IgG: 259 U/mL — ABNORMAL HIGH
EBV VCA IgG: 192 U/mL — ABNORMAL HIGH
EBV VCA IgM: <36 U/mL

## 2018-07-17 LAB — VITAMIN D 25 HYDROXY (VIT D DEFICIENCY, FRACTURES): Vit D, 25-Hydroxy: 25 ng/mL — ABNORMAL LOW (ref 30–100)

## 2018-07-17 LAB — COMPREHENSIVE METABOLIC PANEL
AG Ratio: 1.6 (calc) (ref 1.0–2.5)
ALKALINE PHOSPHATASE (APISO): 58 U/L (ref 33–115)
ALT: 25 U/L (ref 6–29)
AST: 24 U/L (ref 10–35)
Albumin: 3.8 g/dL (ref 3.6–5.1)
BILIRUBIN TOTAL: 0.5 mg/dL (ref 0.2–1.2)
BUN: 8 mg/dL (ref 7–25)
CO2: 26 mmol/L (ref 20–32)
Calcium: 9.1 mg/dL (ref 8.6–10.2)
Chloride: 102 mmol/L (ref 98–110)
Creat: 0.76 mg/dL (ref 0.50–1.10)
Globulin: 2.4 g/dL (calc) (ref 1.9–3.7)
Glucose, Bld: 75 mg/dL (ref 65–99)
Potassium: 4.1 mmol/L (ref 3.5–5.3)
Sodium: 135 mmol/L (ref 135–146)
TOTAL PROTEIN: 6.2 g/dL (ref 6.1–8.1)

## 2018-07-17 LAB — CBC WITH DIFFERENTIAL/PLATELET
BASOS PCT: 0.7 %
Basophils Absolute: 30 cells/uL (ref 0–200)
Eosinophils Absolute: 262 cells/uL (ref 15–500)
Eosinophils Relative: 6.1 %
HCT: 40.7 % (ref 35.0–45.0)
Hemoglobin: 14.3 g/dL (ref 11.7–15.5)
LYMPHS ABS: 937 {cells}/uL (ref 850–3900)
MCH: 31.2 pg (ref 27.0–33.0)
MCHC: 35.1 g/dL (ref 32.0–36.0)
MCV: 88.9 fL (ref 80.0–100.0)
MPV: 9.9 fL (ref 7.5–12.5)
Monocytes Relative: 11.7 %
Neutro Abs: 2567 cells/uL (ref 1500–7800)
Neutrophils Relative %: 59.7 %
PLATELETS: 229 10*3/uL (ref 140–400)
RBC: 4.58 10*6/uL (ref 3.80–5.10)
RDW: 12.2 % (ref 11.0–15.0)
TOTAL LYMPHOCYTE: 21.8 %
WBC: 4.3 10*3/uL (ref 3.8–10.8)
WBCMIX: 503 {cells}/uL (ref 200–950)

## 2018-07-17 LAB — LIPID PANEL
CHOL/HDL RATIO: 4.5 (calc) (ref <5.0)
Cholesterol: 326 mg/dL — ABNORMAL HIGH (ref <200)
HDL: 72 mg/dL (ref >50)
LDL CHOLESTEROL (CALC): 236 mg/dL — AB
NON-HDL CHOLESTEROL (CALC): 254 mg/dL — AB (ref <130)
TRIGLYCERIDES: 77 mg/dL (ref <150)

## 2018-07-17 LAB — TSH+FREE T4: TSH W/REFLEX TO FT4: 1.17 mIU/L

## 2018-07-17 LAB — VITAMIN B12: VITAMIN B 12: 445 pg/mL (ref 200–1100)

## 2018-07-18 LAB — CULTURE, GROUP A STREP
MICRO NUMBER: 91405357
SPECIMEN QUALITY:: ADEQUATE

## 2018-07-21 LAB — PAP IG AND HPV HIGH-RISK: HPV DNA High Risk: DETECTED — AB

## 2018-07-26 ENCOUNTER — Encounter: Payer: Self-pay | Admitting: Family Medicine

## 2018-07-26 DIAGNOSIS — B977 Papillomavirus as the cause of diseases classified elsewhere: Secondary | ICD-10-CM | POA: Insufficient documentation

## 2018-07-26 MED ORDER — LEVOTHYROXINE SODIUM 75 MCG PO TABS: 75.0000 ug | ORAL_TABLET | Freq: Every day | ORAL | 1 refills | Status: DC

## 2018-07-26 NOTE — Assessment & Plan Note (Signed)
On pap 07/16/18 - pap normal but + HR HPV - repeat co-testing in 1 year with HPV typing for type 16 or 18 if still positive.

## 2018-07-28 ENCOUNTER — Encounter: Payer: Self-pay | Admitting: Family Medicine

## 2018-08-04 ENCOUNTER — Telehealth: Payer: Self-pay | Admitting: Family Medicine

## 2018-08-04 NOTE — Telephone Encounter (Signed)
Copied from [place] [phone]. Topic: General - Other >> Aug 04, 2018 11:56 AM Keene Breath wrote: Reason for CRM: Patient called to discuss with the nurse or the doctor her Pap test results.  Patient stated that she has sent several My Chart messages but have not heard anything from the doctor.  Please advise and call patient back at [phone].

## 2018-08-06 MED ORDER — METRONIDAZOLE 0.75 % VA GEL: 1.0000 | Freq: Every day | VAGINAL | 0 refills | Status: DC

## 2018-08-06 NOTE — Telephone Encounter (Signed)
Sent in metrogel - now symptoamtic from pap. [facility] informed pt.

## 2018-09-03 ENCOUNTER — Ambulatory Visit
Admission: RE | Admit: 2018-09-03 | Discharge: 2018-09-03 | Disposition: A | Discharge status: Home / Self Care | Payer: BLUE CROSS/BLUE SHIELD | Source: Ambulatory Visit | Attending: Family Medicine | Admitting: Family Medicine

## 2018-09-03 DIAGNOSIS — Z1231 Encounter for screening mammogram for malignant neoplasm of breast: Secondary | ICD-10-CM

## 2018-10-10 ENCOUNTER — Other Ambulatory Visit: Payer: Self-pay | Admitting: Family Medicine

## 2018-10-12 NOTE — Telephone Encounter (Signed)
Requested Prescriptions  Pending Prescriptions Disp Refills  . PROAIR HFA 108 (90 Base) MCG/ACT inhaler [Pharmacy Med Name: PROAIR HFA ORAL INH (200  PFS) 8.5G] 8.5 g 0    Sig: INHALE TWO PUFFS BY MOUTH EVERY 4 HOURS AS NEEDED     Pulmonology:  Beta Agonists Failed - 10/10/2018  7:13 AM      Failed - One inhaler should last at least one month. If the patient is requesting refills earlier, contact the patient to check for uncontrolled symptoms.      Passed - Valid encounter within last 12 months    Recent Outpatient Visits          2 months ago Annual physical exam   [facility], Laurey Arrow, MD   5 months ago Acquired hypothyroidism   [facility], Laurey Arrow, MD   8 months ago Sore throat   [facility], Laurey Arrow, MD   9 months ago Acquired hypothyroidism   [facility], Laurey Arrow, MD   1 year ago Cough   [facility], Ines Bloomer, MD

## 2018-10-24 ENCOUNTER — Encounter: Payer: Self-pay | Admitting: Family Medicine

## 2019-03-03 ENCOUNTER — Telehealth: Payer: BLUE CROSS/BLUE SHIELD | Admitting: Family

## 2019-03-03 DIAGNOSIS — F3176 Bipolar disorder, in full remission, most recent episode depressed: Secondary | ICD-10-CM | POA: Diagnosis not present

## 2019-03-03 DIAGNOSIS — F3174 Bipolar disorder, in full remission, most recent episode manic: Secondary | ICD-10-CM | POA: Diagnosis not present

## 2019-03-03 DIAGNOSIS — Z20822 Contact with and (suspected) exposure to covid-19: Secondary | ICD-10-CM

## 2019-03-03 NOTE — Progress Notes (Signed)
E-Visit for Corona Virus Screening   Your current symptoms could be consistent with the coronavirus.  I have sent your note to our Community Testing site. They should reach out to you in the next 24 hours to set up a time for testing if you qualify.   Approximately 5 minutes was spent documenting and reviewing patient's chart.    COVID-19 is a respiratory illness with symptoms that are similar to the flu. Symptoms are typically mild to moderate, but there have been cases of severe illness and death due to the virus. The following symptoms may appear 2-14 days after exposure: . Fever . Cough . Shortness of breath or difficulty breathing . Chills . Repeated shaking with chills . Muscle pain . Headache . Sore throat . New loss of taste or smell . Fatigue . Congestion or runny nose . Nausea or vomiting . Diarrhea  It is vitally important that if you feel that you have an infection such as this virus or any other virus that you stay home and away from places where you may spread it to others.  You should self-quarantine for 14 days if you have symptoms that could potentially be coronavirus or have been in close contact a with a person diagnosed with COVID-19 within the last 2 weeks. You should avoid contact with people age 20 and older.   You should wear a mask or cloth face covering over your nose and mouth if you must be around other people or animals, including pets (even at home). Try to stay at least 6 feet away from other people. This will protect the people around you.   You may also take acetaminophen (Tylenol) as needed for fever.   Reduce your risk of any infection by using the same precautions used for avoiding the common cold or flu:  Marland Kitchen Wash your hands often with soap and warm water for at least 20 seconds.  If soap and water are not readily available, use an alcohol-based hand sanitizer with at least 60% alcohol.  . If coughing or sneezing, cover your mouth and nose by  coughing or sneezing into the elbow areas of your shirt or coat, into a tissue or into your sleeve (not your hands). . Avoid shaking hands with others and consider head nods or verbal greetings only. . Avoid touching your eyes, nose, or mouth with unwashed hands.  . Avoid close contact with people who are sick. . Avoid places or events with large numbers of people in one location, like concerts or sporting events. . Carefully consider travel plans you have or are making. . If you are planning any travel outside or inside the [place], visit the CDC's Travelers' Health webpage for the latest health notices. . If you have some symptoms but not all symptoms, continue to monitor at home and seek medical attention if your symptoms worsen. . If you are having a medical emergency, call 911.  HOME CARE . Only take medications as instructed by your medical team. . Drink plenty of fluids and get plenty of rest. . A steam or ultrasonic humidifier can help if you have congestion.   GET HELP RIGHT AWAY IF YOU HAVE EMERGENCY WARNING SIGNS** FOR COVID-19. If you or someone is showing any of these signs seek emergency medical care immediately. Call 911 or proceed to your closest emergency facility if: . You develop worsening high fever. . Trouble breathing . Bluish lips or face . Persistent pain or pressure in the chest . New  confusion . Inability to wake or stay awake . You cough up blood. . Your symptoms become more severe  **This list is not all possible symptoms. Contact your medical provider for any symptoms that are sever or concerning to you.   MAKE SURE YOU   Understand these instructions.  Will watch your condition.  Will get help right away if you are not doing well or get worse.  Your e-visit answers were reviewed by a board certified advanced clinical practitioner to complete your personal care plan.  Depending on the condition, your plan could have included both over the counter or  prescription medications.  If there is a problem please reply once you have received a response from your provider.  Your safety is important to [place].  If you have drug allergies check your prescription carefully.    You can use MyChart to ask questions about today's visit, request a non-urgent call back, or ask for a work or school excuse for 24 hours related to this e-Visit. If it has been greater than 24 hours you will need to follow up with your provider, or enter a new e-Visit to address those concerns. You will get an e-mail in the next two days asking about your experience.  I hope that your e-visit has been valuable and will speed your recovery. Thank you for using e-visits.

## 2019-03-06 DIAGNOSIS — Z20828 Contact with and (suspected) exposure to other viral communicable diseases: Secondary | ICD-10-CM | POA: Diagnosis not present

## 2019-06-03 ENCOUNTER — Ambulatory Visit (INDEPENDENT_AMBULATORY_CARE_PROVIDER_SITE_OTHER): Payer: BC Managed Care – PPO | Admitting: Adult Health Nurse Practitioner

## 2019-06-03 ENCOUNTER — Other Ambulatory Visit: Payer: Self-pay

## 2019-06-03 ENCOUNTER — Encounter: Payer: Self-pay | Admitting: Adult Health Nurse Practitioner

## 2019-06-03 VITALS — BP 130/84 | HR 63 | Temp 98.2°F | Resp 18 | Wt 148.0 lb

## 2019-06-03 DIAGNOSIS — E559 Vitamin D deficiency, unspecified: Secondary | ICD-10-CM | POA: Diagnosis not present

## 2019-06-03 DIAGNOSIS — Z0000 Encounter for general adult medical examination without abnormal findings: Secondary | ICD-10-CM | POA: Insufficient documentation

## 2019-06-03 DIAGNOSIS — E039 Hypothyroidism, unspecified: Secondary | ICD-10-CM

## 2019-06-03 DIAGNOSIS — F3177 Bipolar disorder, in partial remission, most recent episode mixed: Secondary | ICD-10-CM

## 2019-06-03 DIAGNOSIS — F319 Bipolar disorder, unspecified: Secondary | ICD-10-CM

## 2019-06-03 HISTORY — DX: Bipolar disorder, unspecified: F31.9

## 2019-06-03 HISTORY — DX: Encounter for general adult medical examination without abnormal findings: Z00.00

## 2019-06-03 MED ORDER — NORETHIN ACE-ETH ESTRAD-FE 1.5-30 MG-MCG PO TABS: 1.0000 | ORAL_TABLET | Freq: Every day | ORAL | 3 refills | Status: DC

## 2019-06-03 MED ORDER — LEVOTHYROXINE SODIUM 75 MCG PO TABS: 75.0000 ug | ORAL_TABLET | Freq: Every day | ORAL | 1 refills | Status: DC

## 2019-06-03 MED ORDER — NORETHIN ACE-ETH ESTRAD-FE 1.5-30 MG-MCG PO TABS: 1.0000 | ORAL_TABLET | Freq: Every day | ORAL | 11 refills | Status: DC

## 2019-06-03 NOTE — Progress Notes (Signed)
Established Patient Office Visit  Subjective:  Patient ID: Selena Chapman, female    DOB: 28-Jul-1971  Age: 48 y.o. MRN: [id number]  CC:  Chief Complaint  Patient presents with  . Establish Care    need new pcp to manage medications and chronic conditions    HPI Selena Chapman presents to establish care. Reports she has lost 80 + lbs in the past 1-2 years though running, calorie counting, and lifting weights.  She has done well with maintaining her weight.   Problems include hypothyroidism.  Needs updated TSH and refill.  She is not symptomatic currently.  No sensitivity to cold or heat.  Energy level is good.  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   She has pure hypercholesterolemia.  Father recently underwent bypass.  Her lipids have been unchanged since losing weight and increasing exercise.    Hx. Of High risk pap scheduled for November.  Would like OCPs changed and currently doing well on them.     Past Medical History:  Diagnosis Date  . Allergy   . Asthma   . Depression     Past Surgical History:  Procedure Laterality Date  . APPENDECTOMY      Family History  Problem Relation Age of Onset  . Hypertension Mother   . Depression Sister     Social History   Socioeconomic History  . Marital status: Divorced    Spouse name: [name]  . Number of children: 1  . Years of education: Not on file  . Highest education level: Not on file  Occupational History  . Not on file  Social Needs  . Financial resource strain: Not on file  . Food insecurity    Worry: Not on file    Inability: Not on file  . Transportation needs    Medical: Not on file    Non-medical: Not on file  Tobacco Use  . Smoking status: Never Smoker  . Smokeless tobacco: Never Used  Substance and Sexual Activity  . Alcohol use: No  . Drug use: No  . Sexual activity: Not Currently    Birth control/protection: Condom, Pill, Abstinence  Lifestyle  .  Physical activity    Days per week: Not on file    Minutes per session: Not on file  . Stress: Not on file  Relationships  . Social Herbalist on phone: Not on file    Gets together: Not on file    Attends religious service: Not on file    Active member of club or organization: Not on file    Attends meetings of clubs or organizations: Not on file    Relationship status: Not on file  . Intimate partner violence    Fear of current or ex partner: Not on file    Emotionally abused: Not on file    Physically abused: Not on file    Forced sexual activity: Not on file  Other Topics Concern  . Not on file  Social History Narrative  . Not on file    Outpatient Medications Prior to Visit  Medication Sig Dispense Refill  . carbamazepine (TEGRETOL) 200 MG tablet Take 200 mg by mouth daily.     . fexofenadine (ALLEGRA ALLERGY) 180 MG tablet     . lamoTRIgine (LAMICTAL) 200 MG tablet Take 400 mg by mouth at bedtime.  12  . PROAIR HFA 108 (90 Base) MCG/ACT inhaler INHALE TWO PUFFS BY MOUTH EVERY 4  HOURS AS NEEDED 8.5 g 0  . levothyroxine (SYNTHROID, LEVOTHROID) 75 MCG tablet Take 1 tablet (75 mcg total) by mouth daily before breakfast. 90 tablet 1  . norethindrone-ethinyl estradiol-iron (BLISOVI FE 1.5/30) 1.5-30 MG-MCG tablet Take 1 tablet by mouth daily. Skipping the placebo pills. Start new pack after 21d. 4 Package 3  . clonazePAM (KLONOPIN) 1 MG tablet Take 1 tablet (1 mg total) by mouth 3 (three) times daily as needed for anxiety. for anxiety (Patient taking differently: Take 1 mg by mouth as needed for anxiety. for anxiety) 90 tablet 1  . clonazePAM (KLONOPIN) 1 MG tablet TK 1 T PO TID PRN  1  . metroNIDAZOLE (METROGEL VAGINAL) 0.75 % vaginal gel Place 1 Applicatorful vaginally at bedtime. X 5d 70 g 0  . phentermine (ADIPEX-P) 37.5 MG tablet Take 1 tablet (37.5 mg total) by mouth daily before breakfast. 30 tablet 2   No facility-administered medications prior to visit.      No Known Allergies  ROS Review of Systems Review of Systems  Constitutional: Negative for activity change, appetite change, chills and fever.  HENT: Negative for congestion, nosebleeds, trouble swallowing and voice change.   Respiratory: Negative for cough, shortness of breath and wheezing.   Gastrointestinal: Negative for diarrhea, nausea and vomiting.  Genitourinary: Negative for difficulty urinating, dysuria, flank pain and hematuria.  Musculoskeletal: Negative for back pain, joint swelling and neck pain.  Neurological: Negative for dizziness, speech difficulty, light-headedness and numbness.  See HPI. All other review of systems negative.     Objective:    Physical Exam  BP 130/84   Pulse 63   Temp 98.2 F (36.8 C) (Oral)   Resp 18   Wt 148 lb (67.1 kg)   SpO2 96%   BMI 27.06 kg/m  Wt Readings from Last 3 Encounters:  06/03/19 148 lb (67.1 kg)  07/16/18 159 lb (72.1 kg)  04/20/18 168 lb (76.2 kg)   Physical Exam  Constitutional: Oriented to person, place, and time. Appears well-developed and well-nourished.  HENT:  Head: Normocephalic and atraumatic.  Eyes: Conjunctivae and EOM are normal.  Cardiovascular: Normal rate, regular rhythm, normal heart sounds and intact distal pulses.  No murmur heard. Pulmonary/Chest: Effort normal and breath sounds normal. No stridor. No respiratory distress. Has no wheezes.  Neurological: Is alert and oriented to person, place, and time.  Skin: Skin is warm. Capillary refill takes less than 2 seconds.  Psychiatric: Has a normal mood and affect. Behavior is normal. Judgment and thought content normal.    There are no preventive care reminders to display for this patient.  There are no preventive care reminders to display for this patient.  Lab Results  Component Value Date   TSH 1.970 04/20/2018   Lab Results  Component Value Date   WBC 4.3 07/16/2018   HGB 14.3 07/16/2018   HCT 40.7 07/16/2018   MCV 88.9 07/16/2018   PLT  229 07/16/2018   Lab Results  Component Value Date   NA 135 07/16/2018   K 4.1 07/16/2018   CO2 26 07/16/2018   GLUCOSE 75 07/16/2018   BUN 8 07/16/2018   CREATININE 0.76 07/16/2018   BILITOT 0.5 07/16/2018   ALKPHOS 38 (L) 09/23/2017   AST 24 07/16/2018   ALT 25 07/16/2018   PROT 6.2 07/16/2018   ALBUMIN 3.9 09/23/2017   CALCIUM 9.1 07/16/2018   Lab Results  Component Value Date   CHOL 326 (H) 07/16/2018   Lab Results  Component Value Date  HDL 72 07/16/2018   Lab Results  Component Value Date   LDLCALC 236 (H) 07/16/2018   Lab Results  Component Value Date   TRIG 77 07/16/2018   Lab Results  Component Value Date   CHOLHDL 4.5 07/16/2018   Lab Results  Component Value Date   HGBA1C 4.9 03/06/2017      Assessment & Plan:   Problem List Items Addressed This Visit      Endocrine   Acquired hypothyroidism - Primary   Relevant Medications   levothyroxine (SYNTHROID) 75 MCG tablet   Other Relevant Orders   TSH   VITAMIN D 25 Hydroxy (Vit-D Deficiency, Fractures)   CMP14+EGFR    Other Visit Diagnoses    Vitamin D deficiency       Relevant Orders   TSH   VITAMIN D 25 Hydroxy (Vit-D Deficiency, Fractures)   CMP14+EGFR      Meds ordered this encounter  Medications  . levothyroxine (SYNTHROID) 75 MCG tablet    Sig: Take 1 tablet (75 mcg total) by mouth daily before breakfast.    Dispense:  90 tablet    Refill:  1  . DISCONTD: norethindrone-ethinyl estradiol-iron (BLISOVI FE 1.5/30) 1.5-30 MG-MCG tablet    Sig: Take 1 tablet by mouth daily. Skipping the placebo pills. Start new pack after 21d.    Dispense:  4 Package    Refill:  3    4 packs is required for a 3 mo supply of back-to-back OCP use due to using monophasic pill continuously to eliminate menese  . norethindrone-ethinyl estradiol-iron (BLISOVI FE 1.5/30) 1.5-30 MG-MCG tablet    Sig: Take 1 tablet by mouth daily.    Dispense:  1 Package    Refill:  11    Follow-up:   1.  Patient to  f/u in one month for PAP smear--high risk.   2.  Check Vitamin D, TSH, CMP, will check Lipids on f/u.  3.  Health Maintenance.     Glyn Ade, NP

## 2019-06-03 NOTE — Patient Instructions (Signed)
° ° ° °  If you have lab work done today you will be contacted with your lab results within the next 2 weeks.  If you have not heard from us then please contact us. The fastest way to get your results is to register for My Chart. ° ° °IF you received an x-ray today, you will receive an invoice from [facility]. Please contact [facility] at [phone] with questions or concerns regarding your invoice.  ° °IF you received labwork today, you will receive an invoice from [facility]. Please contact [facility] at [phone] with questions or concerns regarding your invoice.  ° °Our billing staff will not be able to assist you with questions regarding bills from these companies. ° °You will be contacted with the lab results as soon as they are available. The fastest way to get your results is to activate your My Chart account. Instructions are located on the last page of this paperwork. If you have not heard from us regarding the results in 2 weeks, please contact this office. °  ° ° ° °

## 2019-06-04 ENCOUNTER — Other Ambulatory Visit: Payer: Self-pay | Admitting: Adult Health Nurse Practitioner

## 2019-06-04 LAB — CMP14+EGFR
ALT: 19 IU/L (ref 0–32)
AST: 21 IU/L (ref 0–40)
Albumin/Globulin Ratio: 1.7 (ref 1.2–2.2)
Albumin: 3.9 g/dL (ref 3.8–4.8)
Alkaline Phosphatase: 61 IU/L (ref 39–117)
BUN/Creatinine Ratio: 20 (ref 9–23)
BUN: 15 mg/dL (ref 6–24)
Bilirubin Total: 0.2 mg/dL (ref 0.0–1.2)
CO2: 22 mmol/L (ref 20–29)
Calcium: 9 mg/dL (ref 8.7–10.2)
Chloride: 103 mmol/L (ref 96–106)
Creatinine, Ser: 0.75 mg/dL (ref 0.57–1.00)
GFR calc Af Amer: 111 mL/min/{1.73_m2} (ref >59)
GFR calc non Af Amer: 96 mL/min/{1.73_m2} (ref >59)
Globulin, Total: 2.3 g/dL (ref 1.5–4.5)
Glucose: 86 mg/dL (ref 65–99)
Potassium: 4.1 mmol/L (ref 3.5–5.2)
Sodium: 141 mmol/L (ref 134–144)
Total Protein: 6.2 g/dL (ref 6.0–8.5)

## 2019-06-04 LAB — TSH: TSH: 1.64 u[IU]/mL (ref 0.450–4.500)

## 2019-06-04 LAB — VITAMIN D 25 HYDROXY (VIT D DEFICIENCY, FRACTURES): Vit D, 25-Hydroxy: 28.3 ng/mL — ABNORMAL LOW (ref 30.0–100.0)

## 2019-06-04 MED ORDER — VITAMIN D HIGH POTENCY 25 MCG (1000 UT) PO CAPS: 1000.0000 [IU] | ORAL_CAPSULE | Freq: Every day | ORAL | 3 refills | Status: AC

## 2019-06-04 NOTE — Progress Notes (Signed)
Your labs looked normal with the exception of Vitamin D.  I will send a prescription to your pharmacy to supplement and would recommend rechecking in 6-8 weeks.

## 2019-06-05 ENCOUNTER — Other Ambulatory Visit: Payer: Self-pay | Admitting: Family Medicine

## 2019-06-05 NOTE — Telephone Encounter (Signed)
Forwarding medication refill request to the clinical pool or review.

## 2019-06-14 ENCOUNTER — Other Ambulatory Visit: Payer: Self-pay

## 2019-06-14 ENCOUNTER — Ambulatory Visit: Payer: BC Managed Care – PPO

## 2019-06-18 ENCOUNTER — Ambulatory Visit (INDEPENDENT_AMBULATORY_CARE_PROVIDER_SITE_OTHER): Payer: BC Managed Care – PPO

## 2019-06-18 ENCOUNTER — Ambulatory Visit (INDEPENDENT_AMBULATORY_CARE_PROVIDER_SITE_OTHER): Payer: BC Managed Care – PPO | Admitting: Podiatry

## 2019-06-18 ENCOUNTER — Other Ambulatory Visit: Payer: Self-pay | Admitting: Podiatry

## 2019-06-18 ENCOUNTER — Encounter: Payer: Self-pay | Admitting: Podiatry

## 2019-06-18 ENCOUNTER — Other Ambulatory Visit: Payer: Self-pay

## 2019-06-18 DIAGNOSIS — M79672 Pain in left foot: Secondary | ICD-10-CM

## 2019-06-18 DIAGNOSIS — M722 Plantar fascial fibromatosis: Secondary | ICD-10-CM | POA: Diagnosis not present

## 2019-06-18 DIAGNOSIS — M79671 Pain in right foot: Secondary | ICD-10-CM

## 2019-06-18 MED ORDER — METHYLPREDNISOLONE 4 MG PO TBPK: ORAL_TABLET | ORAL | 0 refills | Status: DC

## 2019-06-18 NOTE — Patient Instructions (Signed)
For instructions on how to put on your Plantar Fascial Brace, please visit [email or web address]   Plantar Fasciitis (Heel Spur Syndrome) with Rehab The plantar fascia is a fibrous, ligament-like, soft-tissue structure that spans the bottom of the foot. Plantar fasciitis is a condition that causes pain in the foot due to inflammation of the tissue. SYMPTOMS   Pain and tenderness on the underneath side of the foot.  Pain that worsens with standing or walking. CAUSES  Plantar fasciitis is caused by irritation and injury to the plantar fascia on the underneath side of the foot. Common mechanisms of injury include:  Direct trauma to bottom of the foot.  Damage to a small nerve that runs under the foot where the main fascia attaches to the heel bone.  Stress placed on the plantar fascia due to bone spurs. RISK INCREASES WITH:   Activities that place stress on the plantar fascia (running, jumping, pivoting, or cutting).  Poor strength and flexibility.  Improperly fitted shoes.  Tight calf muscles.  Flat feet.  Failure to warm-up properly before activity.  Obesity. PREVENTION  Warm up and stretch properly before activity.  Allow for adequate recovery between workouts.  Maintain physical fitness:  Strength, flexibility, and endurance.  Cardiovascular fitness.  Maintain a health body weight.  Avoid stress on the plantar fascia.  Wear properly fitted shoes, including arch supports for individuals who have flat feet.  PROGNOSIS  If treated properly, then the symptoms of plantar fasciitis usually resolve without surgery. However, occasionally surgery is necessary.  RELATED COMPLICATIONS   Recurrent symptoms that may result in a chronic condition.  Problems of the lower back that are caused by compensating for the injury, such as limping.  Pain or weakness of the foot during push-off following surgery.  Chronic inflammation, scarring, and partial or complete  fascia tear, occurring more often from repeated injections.  TREATMENT  Treatment initially involves the use of ice and medication to help reduce pain and inflammation. The use of strengthening and stretching exercises may help reduce pain with activity, especially stretches of the Achilles tendon. These exercises may be performed at home or with a therapist. Your caregiver may recommend that you use heel cups of arch supports to help reduce stress on the plantar fascia. Occasionally, corticosteroid injections are given to reduce inflammation. If symptoms persist for greater than 6 months despite non-surgical (conservative), then surgery may be recommended.   MEDICATION   If pain medication is necessary, then nonsteroidal anti-inflammatory medications, such as aspirin and ibuprofen, or other minor pain relievers, such as acetaminophen, are often recommended.  Do not take pain medication within 7 days before surgery.  Prescription pain relievers may be given if deemed necessary by your caregiver. Use only as directed and only as much as you need.  Corticosteroid injections may be given by your caregiver. These injections should be reserved for the most serious cases, because they may only be given a certain number of times.  HEAT AND COLD  Cold treatment (icing) relieves pain and reduces inflammation. Cold treatment should be applied for 10 to 15 minutes every 2 to 3 hours for inflammation and pain and immediately after any activity that aggravates your symptoms. Use ice packs or massage the area with a piece of ice (ice massage).  Heat treatment may be used prior to performing the stretching and strengthening activities prescribed by your caregiver, physical therapist, or athletic trainer. Use a heat pack or soak the injury in warm water.  Agenda  CARE IF:  Treatment seems to offer no benefit, or the condition worsens.  Any medications produce adverse side effects.   EXERCISES- RANGE OF MOTION (ROM) AND STRETCHING EXERCISES - Plantar Fasciitis (Heel Spur Syndrome) These exercises may help you when beginning to rehabilitate your injury. Your symptoms may resolve with or without further involvement from your physician, physical therapist or athletic trainer. While completing these exercises, remember:   Restoring tissue flexibility helps normal motion to return to the joints. This allows healthier, less painful movement and activity.  An effective stretch should be held for at least 30 seconds.  A stretch should never be painful. You should only feel a gentle lengthening or release in the stretched tissue.  RANGE OF MOTION - Toe Extension, Flexion  Sit with your right / left leg crossed over your opposite knee.  Grasp your toes and gently pull them back toward the top of your foot. You should feel a stretch on the bottom of your toes and/or foot.  Hold this stretch for 10 seconds.  Now, gently pull your toes toward the bottom of your foot. You should feel a stretch on the top of your toes and or foot.  Hold this stretch for 10 seconds. Repeat  times. Complete this stretch 3 times per day.   RANGE OF MOTION - Ankle Dorsiflexion, Active Assisted  Remove shoes and sit on a chair that is preferably not on a carpeted surface.  Place right / left foot under knee. Extend your opposite leg for support.  Keeping your heel down, slide your right / left foot back toward the chair until you feel a stretch at your ankle or calf. If you do not feel a stretch, slide your bottom forward to the edge of the chair, while still keeping your heel down.  Hold this stretch for 10 seconds. Repeat 3 times. Complete this stretch 2 times per day.   STRETCH  Gastroc, Standing  Place hands on wall.  Extend right / left leg, keeping the front knee somewhat bent.  Slightly point your toes inward on your back foot.  Keeping your right / left heel on the floor and your  knee straight, shift your weight toward the wall, not allowing your back to arch.  You should feel a gentle stretch in the right / left calf. Hold this position for 10 seconds. Repeat 3 times. Complete this stretch 2 times per day.  STRETCH  Soleus, Standing  Place hands on wall.  Extend right / left leg, keeping the other knee somewhat bent.  Slightly point your toes inward on your back foot.  Keep your right / left heel on the floor, bend your back knee, and slightly shift your weight over the back leg so that you feel a gentle stretch deep in your back calf.  Hold this position for 10 seconds. Repeat 3 times. Complete this stretch 2 times per day.  STRETCH  Gastrocsoleus, Standing  Note: This exercise can place a lot of stress on your foot and ankle. Please complete this exercise only if specifically instructed by your caregiver.   Place the ball of your right / left foot on a step, keeping your other foot firmly on the same step.  Hold on to the wall or a rail for balance.  Slowly lift your other foot, allowing your body weight to press your heel down over the edge of the step.  You should feel a stretch in your right / left calf.  Hold this  position for 10 seconds.  Repeat this exercise with a slight bend in your right / left knee. Repeat 3 times. Complete this stretch 2 times per day.   STRENGTHENING EXERCISES - Plantar Fasciitis (Heel Spur Syndrome)  These exercises may help you when beginning to rehabilitate your injury. They may resolve your symptoms with or without further involvement from your physician, physical therapist or athletic trainer. While completing these exercises, remember:   Muscles can gain both the endurance and the strength needed for everyday activities through controlled exercises.  Complete these exercises as instructed by your physician, physical therapist or athletic trainer. Progress the resistance and repetitions only as guided.  STRENGTH -  Towel Curls  Sit in a chair positioned on a non-carpeted surface.  Place your foot on a towel, keeping your heel on the floor.  Pull the towel toward your heel by only curling your toes. Keep your heel on the floor. Repeat 3 times. Complete this exercise 2 times per day.  STRENGTH - Ankle Inversion  Secure one end of a rubber exercise band/tubing to a fixed object (table, pole). Loop the other end around your foot just before your toes.  Place your fists between your knees. This will focus your strengthening at your ankle.  Slowly, pull your big toe up and in, making sure the band/tubing is positioned to resist the entire motion.  Hold this position for 10 seconds.  Have your muscles resist the band/tubing as it slowly pulls your foot back to the starting position. Repeat 3 times. Complete this exercises 2 times per day.  Document Released: 08/12/2005 Document Revised: 11/04/2011 Document Reviewed: 11/24/2008 [facility] Patient Information 2014 [place], [place].

## 2019-06-18 NOTE — Progress Notes (Signed)
Subjective:  Patient ID: Selena Chapman, female    DOB: 12-18-1970,  MRN: [id number]  Chief Complaint  Patient presents with  . Foot Pain    Bilateral heel pain, right is worse than left. No known injuries, but pt states she is a runner. 2 week duration.    48 y.o. female presents with the above complaint.  She states that this has been very painful last 2 months.  She has not tried any conservative therapy.  She states that she has bought some over-the-counter orthotics which has not helped.  She has not seen any other previous podiatrist.  She states that her pain is worse when she gets out of the morning bilaterally right little bit more than left.   Review of Systems: Negative except as noted in the HPI. Denies N/V/F/Ch.  Past Medical History:  Diagnosis Date  . Allergy   . Asthma   . Bipolar disorder ([place]) 06/03/2019  . Depression   . Physical exam, annual 06/03/2019    Current Outpatient Medications:  .  carbamazepine (TEGRETOL) 200 MG tablet, Take 200 mg by mouth daily. , Disp: , Rfl:  .  Cholecalciferol (VITAMIN D HIGH POTENCY) 25 MCG (1000 UT) capsule, Take 1 capsule (1,000 Units total) by mouth daily., Disp: 30 capsule, Rfl: 3 .  lamoTRIgine (LAMICTAL) 200 MG tablet, Take 400 mg by mouth at bedtime., Disp: , Rfl: 12 .  levothyroxine (SYNTHROID) 75 MCG tablet, Take 1 tablet (75 mcg total) by mouth daily before breakfast., Disp: 90 tablet, Rfl: 1 .  norethindrone-ethinyl estradiol-iron (BLISOVI FE 1.5/30) 1.5-30 MG-MCG tablet, Take 1 tablet by mouth daily., Disp: 1 Package, Rfl: 11 .  PROAIR HFA 108 (90 Base) MCG/ACT inhaler, INHALE 2 PUFFS BY MOUTH EVERY 4 HOURS AS NEEDED, Disp: 8.5 g, Rfl: 0 .  clonazePAM (KLONOPIN) 1 MG tablet, Take 1 tablet (1 mg total) by mouth 3 (three) times daily as needed for anxiety. for anxiety (Patient taking differently: Take 1 mg by mouth as needed for anxiety. for anxiety), Disp: 90 tablet, Rfl: 1 .  fexofenadine (ALLEGRA ALLERGY) 180 MG tablet,  , Disp: , Rfl:  .  methylPREDNISolone (MEDROL DOSEPAK) 4 MG TBPK tablet, Use as directed, Disp: 1 each, Rfl: 0  Social History   Tobacco Use  Smoking Status Never Smoker  Smokeless Tobacco Never Used    No Known Allergies Objective:  There were no vitals filed for this visit. There is no height or weight on file to calculate BMI. Constitutional Well developed. Well nourished.  Vascular Dorsalis pedis pulses palpable bilaterally. Posterior tibial pulses palpable bilaterally. Capillary refill normal to all digits.  No cyanosis or clubbing noted. Pedal hair growth normal.  Neurologic Normal speech. Oriented to person, place, and time. Epicritic sensation to light touch grossly present bilaterally.  Dermatologic Nails well groomed and normal in appearance. No open wounds. No skin lesions.  Orthopedic: Normal joint ROM without pain or crepitus bilaterally. No visible deformities. Tender to palpation at the calcaneal tuber bilaterally. No pain with calcaneal squeeze bilaterally. Ankle ROM diminished range of motion bilaterally. Silfverskiold Test: positive bilaterally.   Radiographs: Taken and reviewed. No acute fractures or dislocations. No evidence of stress fracture.  Plantar heel spur present. Posterior heel spur present.   Assessment:   1. Pain in left foot   2. Pain in right foot    Plan:  Patient was evaluated and treated and all questions answered.  Plantar Fasciitis, bilaterally - XR reviewed as above.  - Educated on  icing and stretching. Instructions given.  - Injection delivered to the plantar fascia as below. - DME: Plantar Fascial Brace x2 - Pharmacologic management: Medrol Dose Pak. Educated on risks/benefits and proper taking of medication.  -Discussed with her about purchasing [facility] orthotics or power steps for plantar fasciitis. .  Procedure: Injection Tendon/Ligament Location: Bilateral plantar fascia at the glabrous junction; medial approach. Skin  Prep: alcohol Injectate: 0.5 cc 0.5% marcaine plain, 0.5 cc of 1% Lidocaine, 0.5 cc kenalog 10. Disposition: Patient tolerated procedure well. Injection site dressed with a band-aid.  No follow-ups on file.

## 2019-06-24 ENCOUNTER — Ambulatory Visit: Payer: BC Managed Care – PPO

## 2019-06-24 ENCOUNTER — Other Ambulatory Visit: Payer: Self-pay

## 2019-06-29 ENCOUNTER — Other Ambulatory Visit (HOSPITAL_COMMUNITY)
Admission: RE | Admit: 2019-06-29 | Discharge: 2019-06-29 | Disposition: A | Discharge status: Home / Self Care | Payer: BC Managed Care – PPO | Source: Ambulatory Visit | Attending: Adult Health Nurse Practitioner | Admitting: Adult Health Nurse Practitioner

## 2019-06-29 ENCOUNTER — Other Ambulatory Visit: Payer: Self-pay

## 2019-06-29 ENCOUNTER — Ambulatory Visit: Payer: BC Managed Care – PPO | Admitting: Adult Health Nurse Practitioner

## 2019-06-29 ENCOUNTER — Encounter: Payer: Self-pay | Admitting: Adult Health Nurse Practitioner

## 2019-06-29 VITALS — BP 126/82 | HR 53 | Temp 98.1°F | Resp 16 | Ht 63.78 in | Wt 150.0 lb

## 2019-06-29 DIAGNOSIS — Z01411 Encounter for gynecological examination (general) (routine) with abnormal findings: Secondary | ICD-10-CM | POA: Diagnosis not present

## 2019-06-29 DIAGNOSIS — E7800 Pure hypercholesterolemia, unspecified: Secondary | ICD-10-CM | POA: Diagnosis not present

## 2019-06-29 DIAGNOSIS — B977 Papillomavirus as the cause of diseases classified elsewhere: Secondary | ICD-10-CM

## 2019-06-29 NOTE — Progress Notes (Signed)
Established Patient Office Visit  Subjective:  Patient ID: Selena Chapman, female    DOB: 11-01-1970  Age: 48 y.o. MRN: [id number]  CC:  Chief Complaint  Patient presents with  . Gynecologic Exam    wants HPV typed, had discussed with prev PCP   . Follow-up    per prev OV notes, repeat TSH, Vit D, CMP, Lipid     HPI Selena Chapman presents for PAP smear.  High risk HPV detected last year.  She would like phenotyping which we have already ordered.  Declined STI testing.  She has been fasting for lipids today which we will check.   Past Medical History:  Diagnosis Date  . Allergy   . Asthma   . Bipolar disorder ([place]) 06/03/2019  . Depression   . Physical exam, annual 06/03/2019    Past Surgical History:  Procedure Laterality Date  . APPENDECTOMY      Family History  Problem Relation Age of Onset  . Hypertension Mother   . Depression Sister     Social History   Socioeconomic History  . Marital status: Divorced    Spouse name: [name]  . Number of children: 1  . Years of education: Not on file  . Highest education level: Not on file  Occupational History  . Not on file  Social Needs  . Financial resource strain: Not on file  . Food insecurity    Worry: Not on file    Inability: Not on file  . Transportation needs    Medical: Not on file    Non-medical: Not on file  Tobacco Use  . Smoking status: Never Smoker  . Smokeless tobacco: Never Used  Substance and Sexual Activity  . Alcohol use: No  . Drug use: No  . Sexual activity: Not Currently    Birth control/protection: Condom, Pill, Abstinence  Lifestyle  . Physical activity    Days per week: Not on file    Minutes per session: Not on file  . Stress: Not on file  Relationships  . Social Herbalist on phone: Not on file    Gets together: Not on file    Attends religious service: Not on file    Active member of club or organization: Not on file    Attends meetings of clubs or  organizations: Not on file    Relationship status: Not on file  . Intimate partner violence    Fear of current or ex partner: Not on file    Emotionally abused: Not on file    Physically abused: Not on file    Forced sexual activity: Not on file  Other Topics Concern  . Not on file  Social History Narrative  . Not on file    Outpatient Medications Prior to Visit  Medication Sig Dispense Refill  . carbamazepine (TEGRETOL) 200 MG tablet Take 200 mg by mouth daily.     . Cholecalciferol (VITAMIN D HIGH POTENCY) 25 MCG (1000 UT) capsule Take 1 capsule (1,000 Units total) by mouth daily. 30 capsule 3  . clonazePAM (KLONOPIN) 1 MG tablet Take 1 tablet (1 mg total) by mouth 3 (three) times daily as needed for anxiety. for anxiety (Patient taking differently: Take 1 mg by mouth as needed for anxiety. for anxiety) 90 tablet 1  . lamoTRIgine (LAMICTAL) 200 MG tablet Take 400 mg by mouth at bedtime.  12  . levothyroxine (SYNTHROID) 75 MCG tablet Take 1 tablet (75 mcg total) by mouth  daily before breakfast. 90 tablet 1  . norethindrone-ethinyl estradiol-iron (BLISOVI FE 1.5/30) 1.5-30 MG-MCG tablet Take 1 tablet by mouth daily. 1 Package 11  . PROAIR HFA 108 (90 Base) MCG/ACT inhaler INHALE 2 PUFFS BY MOUTH EVERY 4 HOURS AS NEEDED 8.5 g 0  . fexofenadine (ALLEGRA ALLERGY) 180 MG tablet     . methylPREDNISolone (MEDROL DOSEPAK) 4 MG TBPK tablet Use as directed 1 each 0   No facility-administered medications prior to visit.     No Known Allergies  ROS Review of Systems    Objective:    Physical Exam  Constitutional: She appears well-developed and well-nourished.  Genitourinary:    Vagina, uterus and rectum normal.  Cervix exhibits no motion tenderness. Right adnexum displays no tenderness and no fullness. Left adnexum displays no tenderness and no fullness.  Lymphadenopathy:       Right: No inguinal adenopathy present.       Left: No inguinal adenopathy present.  Nursing note and vitals  reviewed.   BP 126/82 (BP Location: Right Arm, Patient Position: Sitting, Cuff Size: Normal)   Pulse (!) 53   Temp 98.1 F (36.7 C) (Oral)   Resp 16   Ht 5' 3.78" (1.62 m)   Wt 150 lb (68 kg)   SpO2 100%   BMI 25.93 kg/m  Wt Readings from Last 3 Encounters:  06/29/19 150 lb (68 kg)  06/03/19 148 lb (67.1 kg)  07/16/18 159 lb (72.1 kg)    Lab Results  Component Value Date   TSH 1.640 06/03/2019   Lab Results  Component Value Date   WBC 4.3 07/16/2018   HGB 14.3 07/16/2018   HCT 40.7 07/16/2018   MCV 88.9 07/16/2018   PLT 229 07/16/2018   Lab Results  Component Value Date   NA 141 06/03/2019   K 4.1 06/03/2019   CO2 22 06/03/2019   GLUCOSE 86 06/03/2019   BUN 15 06/03/2019   CREATININE 0.75 06/03/2019   BILITOT 0.2 06/03/2019   ALKPHOS 61 06/03/2019   AST 21 06/03/2019   ALT 19 06/03/2019   PROT 6.2 06/03/2019   ALBUMIN 3.9 06/03/2019   CALCIUM 9.0 06/03/2019   Lab Results  Component Value Date   CHOL 326 (H) 07/16/2018   Lab Results  Component Value Date   HDL 72 07/16/2018   Lab Results  Component Value Date   LDLCALC 236 (H) 07/16/2018   Lab Results  Component Value Date   TRIG 77 07/16/2018   Lab Results  Component Value Date   CHOLHDL 4.5 07/16/2018   Lab Results  Component Value Date   HGBA1C 4.9 03/06/2017      Assessment & Plan:   Problem List Items Addressed This Visit      Other   Hyperlipidemia (Chronic)   Relevant Orders   Lipid panel   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 Lipid panel   PAP smear with genotyping for HPV.  Will f/u with results.    Glyn Ade, NP

## 2019-06-30 LAB — LIPID PANEL
Chol/HDL Ratio: 2.8 ratio (ref 0.0–4.4)
Cholesterol, Total: 288 mg/dL — ABNORMAL HIGH (ref 100–199)
HDL: 103 mg/dL (ref >39)
LDL Chol Calc (NIH): 171 mg/dL — ABNORMAL HIGH (ref 0–99)
Triglycerides: 85 mg/dL (ref 0–149)
VLDL Cholesterol Cal: 14 mg/dL (ref 5–40)

## 2019-07-01 ENCOUNTER — Telehealth: Payer: Self-pay | Admitting: Adult Health Nurse Practitioner

## 2019-07-01 LAB — CYTOLOGY - PAP
Adequacy: ABSENT
Diagnosis: NEGATIVE

## 2019-07-01 NOTE — Telephone Encounter (Signed)
Reviewed lab results over phone with patient.  No abnormal PAP. Negative HPV.   LDL 171 with fam hx of heart disease.  HDL is 102.  No medication at this time. F/U in 6 months.

## 2019-07-16 ENCOUNTER — Ambulatory Visit: Payer: BC Managed Care – PPO | Admitting: Podiatry

## 2019-07-16 ENCOUNTER — Other Ambulatory Visit: Payer: Self-pay

## 2019-07-16 DIAGNOSIS — M79672 Pain in left foot: Secondary | ICD-10-CM | POA: Diagnosis not present

## 2019-07-16 DIAGNOSIS — M2142 Flat foot [pes planus] (acquired), left foot: Secondary | ICD-10-CM

## 2019-07-16 DIAGNOSIS — M722 Plantar fascial fibromatosis: Secondary | ICD-10-CM | POA: Diagnosis not present

## 2019-07-16 DIAGNOSIS — M2141 Flat foot [pes planus] (acquired), right foot: Secondary | ICD-10-CM | POA: Diagnosis not present

## 2019-07-16 DIAGNOSIS — M79671 Pain in right foot: Secondary | ICD-10-CM | POA: Diagnosis not present

## 2019-07-19 ENCOUNTER — Encounter: Payer: Self-pay | Admitting: Podiatry

## 2019-07-19 ENCOUNTER — Telehealth: Payer: Self-pay | Admitting: Adult Health Nurse Practitioner

## 2019-07-19 NOTE — Progress Notes (Signed)
Subjective:  Patient ID: Selena Chapman, female    DOB: [birth year],  MRN: [id number]  Chief Complaint  Patient presents with  . Foot Pain    plantar fasciitis, pt states that her foot pain is on and off, pt also states that her foot pain is varying depending on doing the plantar fascial braces as well    48 y.o. female presents with the above complaint.  Patient presents with a complaint of plantar fasciitis bilateral.  This is a follow-up.  Patient has been doing well patient states that the plantar fascial brace has been helping.  Patient has been doing stretching exercises but not entirely and fully.  She said Medrol Dosepak has also been helping.  But after finishing the Medrol Dosepak her pain started hurting again.  Patient states that it is 50% improved.  She denies any other acute complaints.   Review of Systems: Negative except as noted in the HPI. Denies N/V/F/Ch.  Past Medical History:  Diagnosis Date  . Allergy   . Asthma   . Bipolar disorder ([place]) 06/03/2019  . Depression   . Physical exam, annual 06/03/2019    Current Outpatient Medications:  .  carbamazepine (TEGRETOL) 200 MG tablet, Take 200 mg by mouth daily. , Disp: , Rfl:  .  lamoTRIgine (LAMICTAL) 200 MG tablet, Take 400 mg by mouth at bedtime., Disp: , Rfl: 12 .  levothyroxine (SYNTHROID) 75 MCG tablet, Take 1 tablet (75 mcg total) by mouth daily before breakfast., Disp: 90 tablet, Rfl: 1 .  Norethindrone Acet-Ethinyl Est (AUROVELA 1.5/30 PO), , Disp: , Rfl:  .  PROAIR HFA 108 (90 Base) MCG/ACT inhaler, INHALE 2 PUFFS BY MOUTH EVERY 4 HOURS AS NEEDED, Disp: 8.5 g, Rfl: 0 .  clonazePAM (KLONOPIN) 1 MG tablet, Take 1 tablet (1 mg total) by mouth 3 (three) times daily as needed for anxiety. for anxiety (Patient taking differently: Take 1 mg by mouth as needed for anxiety. for anxiety), Disp: 90 tablet, Rfl: 1 .  norethindrone-ethinyl estradiol-iron (BLISOVI FE 1.5/30) 1.5-30 MG-MCG tablet, Take 1 tablet by mouth  daily., Disp: 1 Package, Rfl: 11  Social History   Tobacco Use  Smoking Status Never Smoker  Smokeless Tobacco Never Used    No Known Allergies Objective:  There were no vitals filed for this visit. There is no height or weight on file to calculate BMI. Constitutional Well developed. Well nourished.  Vascular Dorsalis pedis pulses palpable bilaterally. Posterior tibial pulses palpable bilaterally. Capillary refill normal to all digits.  No cyanosis or clubbing noted. Pedal hair growth normal.  Neurologic Normal speech. Oriented to person, place, and time. Epicritic sensation to light touch grossly present bilaterally.  Dermatologic Nails well groomed and normal in appearance. No open wounds. No skin lesions.  Orthopedic: Normal joint ROM without pain or crepitus bilaterally. No visible deformities. Tender to palpation at the calcaneal tuber bilaterally. No pain with calcaneal squeeze bilaterally. Ankle ROM diminished range of motion bilaterally. Silfverskiold Test: positive bilaterally.   Radiographs:   Assessment:  No diagnosis found. Plan:  Patient was evaluated and treated and all questions answered.  Plantar Fasciitis, bilaterally - XR reviewed as above.  - Re-educated on icing and stretching. Instructions given.  - Injection delivered to the plantar fascia as below. - DME: Night Brace x2 - Pharmacologic management:  None. Educated on risks/benefits and proper taking of medication.  Pes planus deformity -I explained to the patient the etiology and various conservative treatment options available for treating pes planus deformity.  I explained to her that plantar fasciitis is a result of bilateral pes planus.  I believe that patient will benefit from custom-made orthotics to help address the hindfoot calcaneal valgus and supporting arch. -Patient will be scheduled see rec to make her custom-made orthotics  Procedure: Injection Tendon/Ligament Location: Bilateral  plantar fascia at the glabrous junction; medial approach. Skin Prep: alcohol Injectate: 0.5 cc 0.5% marcaine plain, 0.5 cc of 1% Lidocaine, 0.5 cc kenalog 10. Disposition: Patient tolerated procedure well. Injection site dressed with a band-aid.  Return in about 6 weeks (around 08/27/2019).

## 2019-07-19 NOTE — Telephone Encounter (Signed)
Pt has a question about her pap labs.  She says that the test did not look for hpv.   Also pt says she is positive for BV, and would like rx called in as she is having symptoms.  cb is [phone] Thanks

## 2019-07-20 NOTE — Telephone Encounter (Signed)
Please advise 

## 2019-07-26 NOTE — Telephone Encounter (Signed)
Pt is checking on status of this message. She is very concerned. Please advise at [phone]

## 2019-07-26 NOTE — Telephone Encounter (Signed)
Please advise. Dgaddy, CMA

## 2019-07-27 ENCOUNTER — Other Ambulatory Visit: Payer: BC Managed Care – PPO | Admitting: Orthotics

## 2019-07-30 ENCOUNTER — Encounter: Payer: Self-pay | Admitting: Adult Health Nurse Practitioner

## 2019-07-30 ENCOUNTER — Other Ambulatory Visit: Payer: Self-pay | Admitting: Adult Health Nurse Practitioner

## 2019-07-30 MED ORDER — METRONIDAZOLE 500 MG PO TABS: 500.0000 mg | ORAL_TABLET | Freq: Three times a day (TID) | ORAL | 0 refills | Status: DC

## 2019-07-30 MED ORDER — NORETHIN ACE-ETH ESTRAD-FE 1.5-30 MG-MCG PO TABS: 1.0000 | ORAL_TABLET | Freq: Every day | ORAL | 1 refills | Status: DC

## 2019-07-30 NOTE — Progress Notes (Signed)
Patient called regarding Pap/HPV.  Discussed the reflex HPV if ASCUS is found which it as not.  She is satisfied with that.  Feels that she is getting BV symptoms again.  Will treat presumptively and if it returns, she will come in and self swab.  She is inline with that plan.  Also, needs 3 month supply of OCP

## 2019-09-01 ENCOUNTER — Other Ambulatory Visit: Payer: Self-pay

## 2019-09-01 ENCOUNTER — Ambulatory Visit: Payer: BC Managed Care – PPO | Admitting: Podiatry

## 2019-09-01 DIAGNOSIS — M79672 Pain in left foot: Secondary | ICD-10-CM

## 2019-09-01 DIAGNOSIS — M722 Plantar fascial fibromatosis: Secondary | ICD-10-CM

## 2019-09-01 DIAGNOSIS — M79671 Pain in right foot: Secondary | ICD-10-CM

## 2019-09-02 ENCOUNTER — Encounter: Payer: Self-pay | Admitting: Podiatry

## 2019-09-02 NOTE — Progress Notes (Signed)
Subjective:  Patient ID: Selena Chapman, female    DOB: August 04, 1971,  MRN: [id number]  Chief Complaint  Patient presents with  . Plantar Fasciitis    pt is here for a f/u on plantar fasciitis, pt states that the injection she recieved last time has worn off, pt also states that her foot pain is more of an "annoyance" pain, instead of actual "pain".    49 y.o. female presents with the above complaint.  Patient is here to follow-up for bilateral plantar fasciitis.  She states that she still has some mild discomfort when she is ambulating but overall much less pain from before see me.  She states the plantar fascial pressure braces have been helping a lot.  She states that they have been worn out and she would like to know if she can get another pair.  She says injections wore off after a couple of weeks but it did give her good relief.  She states that she got casted for orthotics she will be picking it up after on January 21.  She has not been using night splint as it is been causing her some pain when she is puts it on.  She denies any other acute complaints.   Review of Systems: Negative except as noted in the HPI. Denies N/V/F/Ch.  Past Medical History:  Diagnosis Date  . Allergy   . Asthma   . Bipolar disorder ([place]) 06/03/2019  . Depression   . Physical exam, annual 06/03/2019    Current Outpatient Medications:  .  carbamazepine (TEGRETOL) 200 MG tablet, Take 200 mg by mouth daily. , Disp: , Rfl:  .  D3-1000 25 MCG (1000 UT) capsule, Take 1,000 Units by mouth daily., Disp: , Rfl:  .  lamoTRIgine (LAMICTAL) 200 MG tablet, Take 400 mg by mouth at bedtime., Disp: , Rfl: 12 .  levothyroxine (SYNTHROID) 75 MCG tablet, Take 1 tablet (75 mcg total) by mouth daily before breakfast., Disp: 90 tablet, Rfl: 1 .  norethindrone-ethinyl estradiol-iron (BLISOVI FE 1.5/30) 1.5-30 MG-MCG tablet, Take 1 tablet by mouth daily., Disp: 90 tablet, Rfl: 1 .  PROAIR HFA 108 (90 Base) MCG/ACT inhaler, INHALE 2  PUFFS BY MOUTH EVERY 4 HOURS AS NEEDED, Disp: 8.5 g, Rfl: 0 .  clonazePAM (KLONOPIN) 1 MG tablet, Take 1 tablet (1 mg total) by mouth 3 (three) times daily as needed for anxiety. for anxiety (Patient taking differently: Take 1 mg by mouth as needed for anxiety. for anxiety), Disp: 90 tablet, Rfl: 1 .  metroNIDAZOLE (FLAGYL) 500 MG tablet, Take 1 tablet (500 mg total) by mouth 3 (three) times daily., Disp: 21 tablet, Rfl: 0 .  Norethindrone Acet-Ethinyl Est (AUROVELA 1.5/30 PO), , Disp: , Rfl:   Social History   Tobacco Use  Smoking Status Never Smoker  Smokeless Tobacco Never Used    No Known Allergies Objective:  There were no vitals filed for this visit. There is no height or weight on file to calculate BMI. Constitutional Well developed. Well nourished.  Vascular Dorsalis pedis pulses palpable bilaterally. Posterior tibial pulses palpable bilaterally. Capillary refill normal to all digits.  No cyanosis or clubbing noted. Pedal hair growth normal.  Neurologic Normal speech. Oriented to person, place, and time. Epicritic sensation to light touch grossly present bilaterally.  Dermatologic Nails well groomed and normal in appearance. No open wounds. No skin lesions.  Orthopedic: Normal joint ROM without pain or crepitus bilaterally. No visible deformities. Mild tender to palpation at the calcaneal tuber bilaterally. No  pain with calcaneal squeeze bilaterally. Ankle ROM diminished range of motion bilaterally. Silfverskiold Test: positive bilaterally.   Radiographs:   Assessment:   1. Plantar fasciitis, left   2. Plantar fasciitis of right foot   3. Pain in left foot   4. Pain in right foot    Plan:  Patient was evaluated and treated and all questions answered.  Plantar Fasciitis, bilaterally -I will hold off on the injection given that they have decreased the amount of it she is having.  She still has mild pain to both of the heel.  Patient agrees with this plan. -I  have dispensed bilateral plantar fascial braces as the last pair has been completely worn out. -If he continues to have pain even mild I will plan on giving her injection when I see her back in 6 weeks.  This will give her ample time to try of the orthotics as well as assess how much pain she is actually having on daily basis.  Pes planus deformity -I explained to the patient the etiology and various conservative treatment options available for treating pes planus deformity.  I explained to her that plantar fasciitis is a result of bilateral pes planus.  I believe that patient will benefit from custom-made orthotics to help address the hindfoot calcaneal valgus and supporting arch. -Patient states that she will be picking up orthotics soon.  I explained to her about breaking into them.  She states that she will do so accordingly.  Return in about 6 weeks (around 10/13/2019) for Plantar fasciitis.

## 2019-09-07 ENCOUNTER — Other Ambulatory Visit: Payer: BC Managed Care – PPO | Admitting: Orthotics

## 2019-09-13 DIAGNOSIS — F3111 Bipolar disorder, current episode manic without psychotic features, mild: Secondary | ICD-10-CM | POA: Diagnosis not present

## 2019-09-16 ENCOUNTER — Other Ambulatory Visit: Payer: Self-pay

## 2019-09-16 ENCOUNTER — Ambulatory Visit (INDEPENDENT_AMBULATORY_CARE_PROVIDER_SITE_OTHER): Payer: BC Managed Care – PPO | Admitting: Orthotics

## 2019-09-16 DIAGNOSIS — M722 Plantar fascial fibromatosis: Secondary | ICD-10-CM

## 2019-09-16 DIAGNOSIS — M2141 Flat foot [pes planus] (acquired), right foot: Secondary | ICD-10-CM

## 2019-09-16 DIAGNOSIS — M2142 Flat foot [pes planus] (acquired), left foot: Secondary | ICD-10-CM

## 2019-09-16 NOTE — Progress Notes (Signed)

## 2019-09-23 ENCOUNTER — Telehealth: Payer: Self-pay | Admitting: Adult Health Nurse Practitioner

## 2019-09-23 NOTE — Telephone Encounter (Signed)
Selena Chapman's daughter Selena Chapman would like to become a new Patient Please advise. Previous provider moved to a new practice.

## 2019-09-23 NOTE — Telephone Encounter (Signed)
ok 

## 2019-09-23 NOTE — Telephone Encounter (Signed)
Ok to schedule patient for np appt with [facility]

## 2019-10-07 ENCOUNTER — Ambulatory Visit: Payer: BC Managed Care – PPO | Admitting: Orthotics

## 2019-10-07 ENCOUNTER — Other Ambulatory Visit: Payer: Self-pay

## 2019-10-07 DIAGNOSIS — M2141 Flat foot [pes planus] (acquired), right foot: Secondary | ICD-10-CM

## 2019-10-07 DIAGNOSIS — M2142 Flat foot [pes planus] (acquired), left foot: Secondary | ICD-10-CM

## 2019-10-07 DIAGNOSIS — M722 Plantar fascial fibromatosis: Secondary | ICD-10-CM

## 2019-10-07 NOTE — Progress Notes (Signed)
Patient came in today to pick up custom made foot orthotics.  The goals were accomplished and the patient reported no dissatisfaction with said orthotics.  Patient was advised of breakin period and how to report any issues. 

## 2019-10-13 ENCOUNTER — Ambulatory Visit: Payer: BC Managed Care – PPO | Admitting: Podiatry

## 2019-10-15 ENCOUNTER — Telehealth: Payer: Self-pay | Admitting: Adult Health Nurse Practitioner

## 2019-10-15 NOTE — Telephone Encounter (Signed)
Yes that is ok

## 2019-10-15 NOTE — Telephone Encounter (Signed)
Selena Chapman is the daughter of [name]. She  is requesting to become a New Patient of Dr. Lorelei Pont. Please advise

## 2019-10-27 ENCOUNTER — Ambulatory Visit: Payer: BC Managed Care – PPO | Admitting: Podiatry

## 2019-11-02 ENCOUNTER — Other Ambulatory Visit: Payer: Self-pay | Admitting: Adult Health Nurse Practitioner

## 2019-11-02 DIAGNOSIS — Z1231 Encounter for screening mammogram for malignant neoplasm of breast: Secondary | ICD-10-CM

## 2019-11-22 ENCOUNTER — Ambulatory Visit: Payer: BC Managed Care – PPO | Admitting: Podiatry

## 2019-11-22 ENCOUNTER — Ambulatory Visit
Admission: RE | Admit: 2019-11-22 | Discharge: 2019-11-22 | Disposition: A | Discharge status: Home / Self Care | Payer: BC Managed Care – PPO | Source: Ambulatory Visit | Attending: Adult Health Nurse Practitioner | Admitting: Adult Health Nurse Practitioner

## 2019-11-22 ENCOUNTER — Other Ambulatory Visit: Payer: Self-pay

## 2019-11-22 DIAGNOSIS — Z1231 Encounter for screening mammogram for malignant neoplasm of breast: Secondary | ICD-10-CM

## 2019-11-22 DIAGNOSIS — M722 Plantar fascial fibromatosis: Secondary | ICD-10-CM | POA: Diagnosis not present

## 2019-11-22 DIAGNOSIS — M7731 Calcaneal spur, right foot: Secondary | ICD-10-CM | POA: Diagnosis not present

## 2019-11-23 ENCOUNTER — Encounter: Payer: Self-pay | Admitting: Podiatry

## 2019-11-23 NOTE — Progress Notes (Signed)
Subjective:  Patient ID: Selena Chapman, female    DOB: 01/30/1971,  MRN: [id number]  Chief Complaint  Patient presents with  . Foot Pain    pt is here for a f/u of plantar fasciitis of the right foot, pt states that her right foot is still painful, but the pain has moved to the back of the right heel.    49 y.o. female presents with the above complaint.  Patient is here for follow-up of bilateral plantar fasciitis.  Patient states that the right side is still painful is about 5 out of 10.  The left side has completely resolved.  She states the orthotics that she just received has been very helpful.  It does she has noticed a difference.  Overall her pain is decreased from the first time she saw me.  She would like to know if there is another injection likely the last 1 to be done to kind to take most of her pain away to the right side.  She denies any other acute complaints.  She has been wearing the bracing which has been helping.  She has been doing her stretching exercises as well as using the insoles which has helped a lot.  She denies any other acute complaints.   Review of Systems: Negative except as noted in the HPI. Denies N/V/F/Ch.  Past Medical History:  Diagnosis Date  . Allergy   . Asthma   . Bipolar disorder ([place]) 06/03/2019  . Depression   . Physical exam, annual 06/03/2019    Current Outpatient Medications:  .  carbamazepine (TEGRETOL) 200 MG tablet, Take 200 mg by mouth daily. , Disp: , Rfl:  .  D3-1000 25 MCG (1000 UT) capsule, Take 1,000 Units by mouth daily., Disp: , Rfl:  .  lamoTRIgine (LAMICTAL) 200 MG tablet, Take 400 mg by mouth at bedtime., Disp: , Rfl: 12 .  levothyroxine (SYNTHROID) 75 MCG tablet, Take 1 tablet (75 mcg total) by mouth daily before breakfast., Disp: 90 tablet, Rfl: 1 .  PROAIR HFA 108 (90 Base) MCG/ACT inhaler, INHALE 2 PUFFS BY MOUTH EVERY 4 HOURS AS NEEDED, Disp: 8.5 g, Rfl: 0 .  clonazePAM (KLONOPIN) 1 MG tablet, Take 1 tablet (1 mg total)  by mouth 3 (three) times daily as needed for anxiety. for anxiety (Patient taking differently: Take 1 mg by mouth as needed for anxiety. for anxiety), Disp: 90 tablet, Rfl: 1 .  metroNIDAZOLE (FLAGYL) 500 MG tablet, Take 1 tablet (500 mg total) by mouth 3 (three) times daily., Disp: 21 tablet, Rfl: 0 .  Norethindrone Acet-Ethinyl Est (AUROVELA 1.5/30 PO), , Disp: , Rfl:  .  norethindrone-ethinyl estradiol-iron (BLISOVI FE 1.5/30) 1.5-30 MG-MCG tablet, Take 1 tablet by mouth daily., Disp: 90 tablet, Rfl: 1  Social History   Tobacco Use  Smoking Status Never Smoker  Smokeless Tobacco Never Used    No Known Allergies Objective:  There were no vitals filed for this visit. There is no height or weight on file to calculate BMI. Constitutional Well developed. Well nourished.  Vascular Dorsalis pedis pulses palpable bilaterally. Posterior tibial pulses palpable bilaterally. Capillary refill normal to all digits.  No cyanosis or clubbing noted. Pedal hair growth normal.  Neurologic Normal speech. Oriented to person, place, and time. Epicritic sensation to light touch grossly present bilaterally.  Dermatologic Nails well groomed and normal in appearance. No open wounds. No skin lesions.  Orthopedic: Normal joint ROM without pain or crepitus bilaterally. No visible deformities. Mild tender to palpation at  the calcaneal tuber  right side.  No pain on palpation to the left side. No pain with calcaneal squeeze bilaterally. Ankle ROM diminished range of motion bilaterally. Silfverskiold Test: positive bilaterally.   Radiographs:   Assessment:   No diagnosis found. Plan:  Patient was evaluated and treated and all questions answered.  Plantar Fasciitis, bilaterally( primarily right, left side resolved) -I will hold off on the injection given that they have decreased the amount of it she is having.  She still has mild pain to both of the heel.  Patient agrees with this plan. -Patient can  start transitioning away from plantar fascial braces as she is now being managed with custom-made orthotics -I believe patient will benefit from steroid injection to help decrease the pain that she is having to the right side.  I believe she will benefit from a final steroid injection.  Patient states understanding like to proceed with injection.  Procedure: Injection Tendon/Ligament Location: Right plantar fascia at the glabrous junction; medial approach. Skin Prep: alcohol Injectate: 0.5 cc 0.5% marcaine plain, 0.5 cc of 1% Lidocaine, 0.5 cc kenalog 10. Disposition: Patient tolerated procedure well. Injection site dressed with a band-aid.  No follow-ups on file.  Pes planus deformity -I explained to the patient the etiology and various conservative treatment options available for treating pes planus deformity.  I explained to her that plantar fasciitis is a result of bilateral pes planus.  I believe that patient will benefit from custom-made orthotics to help address the hindfoot calcaneal valgus and supporting arch. -Patient has been obtained orthotics and has been wearing them without any acute complaints.  No follow-ups on file.

## 2019-12-29 ENCOUNTER — Telehealth: Payer: Self-pay | Admitting: *Deleted

## 2019-12-29 NOTE — Telephone Encounter (Signed)
I told pt, that MRI if ordered would need a follow up x-ray to show non-progression in the healing so I recommended a follow up appt. Pt agreed and I transferred to scheduler.

## 2019-12-29 NOTE — Telephone Encounter (Signed)
Pt states she has received her 3rd shot for the plantar fasciitis, but the pain has moved more to the heel and she wanted to know if she needed another x-ray or maybe a MRI for a fracture.

## 2019-12-30 ENCOUNTER — Encounter: Payer: Self-pay | Admitting: Podiatry

## 2019-12-30 ENCOUNTER — Telehealth: Payer: Self-pay | Admitting: Adult Health Nurse Practitioner

## 2019-12-30 NOTE — Telephone Encounter (Signed)
Patient needs  What is the name of the medication? norethindrone-ethinyl estradiol-iron (BLISOVI FE 1.5/30) 1.5-30 MG-MCG tablet [[id number]] ENDED     Have you contacted your pharmacy to request a refill? Y  Which pharmacy would you like this sent to? [facility] [place], [place] - [address]  [place], [place] [address]  Phone:  [phone] Fax:  [phone]  DEA #:  [id number]   Patient notified that their request is being sent to the clinical staff for review and that they should receive a call once it is complete. If they do not receive a call within 72 hours they can check with their pharmacy or our office.

## 2019-12-30 NOTE — Telephone Encounter (Signed)
Please Advise

## 2020-01-03 ENCOUNTER — Other Ambulatory Visit: Payer: Self-pay

## 2020-01-03 ENCOUNTER — Encounter: Payer: Self-pay | Admitting: Podiatry

## 2020-01-03 ENCOUNTER — Ambulatory Visit: Payer: BC Managed Care – PPO | Admitting: Podiatrist

## 2020-01-03 ENCOUNTER — Ambulatory Visit: Payer: BC Managed Care – PPO | Admitting: Podiatry

## 2020-01-03 ENCOUNTER — Ambulatory Visit (INDEPENDENT_AMBULATORY_CARE_PROVIDER_SITE_OTHER): Payer: BC Managed Care – PPO

## 2020-01-03 DIAGNOSIS — M722 Plantar fascial fibromatosis: Secondary | ICD-10-CM | POA: Diagnosis not present

## 2020-01-03 DIAGNOSIS — M7731 Calcaneal spur, right foot: Secondary | ICD-10-CM

## 2020-01-03 MED ORDER — NORETHIN ACE-ETH ESTRAD-FE 1.5-30 MG-MCG PO TABS: 1.0000 | ORAL_TABLET | Freq: Every day | ORAL | 1 refills | Status: DC

## 2020-01-03 NOTE — Telephone Encounter (Signed)
Patient is calling stating the below medication was denied. Patient is wanting to know why. Patient states she had an OV- 11/20. Please advise Cb- [phone]

## 2020-01-03 NOTE — Progress Notes (Signed)
Chief Complaint  Patient presents with  . Follow-up    for plantar fasciitis, Right foot pain, injection didn't help, throbbing, burning pain       HPI: Patient is 49 y.o. female who presents today for follow-up of plantar fasciitis on the right foot.  Patient states that she has had 3 injections in total and that the last injection did not help.  She states that she has throbbing and burning pain and discomfort at the plantar heel at the insertion of the plantar fascia.   No Known Allergies  Review of systems is reviewed and negative.   Physical Exam  Patient is awake, alert, and oriented x 3.  In no acute distress.    Vascular status is intact with palpable pedal pulses DP and PT bilateral and capillary refill time less than 3 seconds bilateral.  No edema or erythema noted.  Neurological exam reveals epicritic and protective sensation grossly intact bilateral.  Dermatological exam reveals skin is supple and dry to bilateral feet.  No open lesions present.   Musculoskeletal exam: Musculature intact with dorsiflexion, plantarflexion, inversion, eversion. Ankle and First MPJ joint range of motion normal.   pain on palpation plantar medial aspect of the right heel at the insertion of the plantar fascia on the medial calcaneal tubercle is noted.  No significant improvement with steroid injections  Assessment: Plantar fasciitis right resistant to conservative care  Plan: Discussed treatment options and alternatives discussed putting her in a boot and trying physical therapy.  Also discussed a surgical procedure to release the plantar fascia versus EPAT.  At this time patient would like to consider her surgical options.  A walking boot was dispensed and she will try this but she will follow-up with Dr. Posey Pronto for preoperative consult for EPF.  She has any questions prior to this visit she will call otherwise she will be seen back for this visit

## 2020-01-03 NOTE — Patient Instructions (Signed)

## 2020-01-03 NOTE — Telephone Encounter (Signed)
Mediation sent, prior rx as expired

## 2020-01-05 ENCOUNTER — Encounter: Payer: Self-pay | Admitting: Podiatrist

## 2020-01-07 NOTE — Telephone Encounter (Signed)
Referral delivered to [facility].

## 2020-01-17 ENCOUNTER — Ambulatory Visit: Payer: BC Managed Care – PPO | Admitting: Podiatry

## 2020-01-19 ENCOUNTER — Other Ambulatory Visit: Payer: Self-pay

## 2020-01-19 ENCOUNTER — Ambulatory Visit: Payer: BC Managed Care – PPO | Admitting: Podiatry

## 2020-01-19 DIAGNOSIS — M2141 Flat foot [pes planus] (acquired), right foot: Secondary | ICD-10-CM

## 2020-01-19 DIAGNOSIS — M2142 Flat foot [pes planus] (acquired), left foot: Secondary | ICD-10-CM

## 2020-01-19 DIAGNOSIS — M722 Plantar fascial fibromatosis: Secondary | ICD-10-CM | POA: Diagnosis not present

## 2020-01-19 MED ORDER — METHYLPREDNISOLONE 4 MG PO TBPK: ORAL_TABLET | ORAL | 0 refills | Status: DC

## 2020-01-21 ENCOUNTER — Telehealth: Payer: Self-pay | Admitting: *Deleted

## 2020-01-21 ENCOUNTER — Encounter: Payer: Self-pay | Admitting: Podiatry

## 2020-01-21 DIAGNOSIS — M722 Plantar fascial fibromatosis: Secondary | ICD-10-CM

## 2020-01-21 DIAGNOSIS — T148XXA Other injury of unspecified body region, initial encounter: Secondary | ICD-10-CM

## 2020-01-21 NOTE — Progress Notes (Signed)
Subjective:  Patient ID: Selena Chapman, female    DOB: [birth year],  MRN: [id number]  Chief Complaint  Patient presents with  . Foot Pain    pt is here for a surgical consult of the foot, pt states that the right foot is still painful and is looking to have an MRI done before proceeding    49 y.o. female presents with the above complaint.  Patient presents with a follow-up of bilateral plantar fasciitis with left plantar fascial has completely resolved however the right side is giving her a lot of pain.  Patient has been wearing her orthotics which does help her.  Pain is elevated later in the day at 8 out of 10.  Patient has tried stretching it which makes the pain worse.  She denies any other acute complaints.  She would like to discuss surgical intervention at this time as given that she has failed all conservative therapy including injections, orthotics, at home physical therapy.  Review of Systems: Negative except as noted in the HPI. Denies N/V/F/Ch.  Past Medical History:  Diagnosis Date  . Allergy   . Asthma   . Bipolar disorder ([place]) 06/03/2019  . Depression   . Physical exam, annual 06/03/2019    Current Outpatient Medications:  .  carbamazepine (TEGRETOL) 200 MG tablet, Take 200 mg by mouth daily. , Disp: , Rfl:  .  D3-1000 25 MCG (1000 UT) capsule, Take 1,000 Units by mouth daily., Disp: , Rfl:  .  lamoTRIgine (LAMICTAL) 200 MG tablet, Take 400 mg by mouth at bedtime., Disp: , Rfl: 12 .  metroNIDAZOLE (FLAGYL) 500 MG tablet, Take 1 tablet (500 mg total) by mouth 3 (three) times daily., Disp: 21 tablet, Rfl: 0 .  Norethindrone Acet-Ethinyl Est (AUROVELA 1.5/30 PO), , Disp: , Rfl:  .  norethindrone-ethinyl estradiol-iron (BLISOVI FE 1.5/30) 1.5-30 MG-MCG tablet, Take 1 tablet by mouth daily., Disp: 90 tablet, Rfl: 1 .  PROAIR HFA 108 (90 Base) MCG/ACT inhaler, INHALE 2 PUFFS BY MOUTH EVERY 4 HOURS AS NEEDED, Disp: 8.5 g, Rfl: 0 .  clonazePAM (KLONOPIN) 1 MG tablet, Take 1  tablet (1 mg total) by mouth 3 (three) times daily as needed for anxiety. for anxiety (Patient taking differently: Take 1 mg by mouth as needed for anxiety. for anxiety), Disp: 90 tablet, Rfl: 1 .  methylPREDNISolone (MEDROL DOSEPAK) 4 MG TBPK tablet, Use as directed, Disp: 1 each, Rfl: 0  Social History   Tobacco Use  Smoking Status Never Smoker  Smokeless Tobacco Never Used    No Known Allergies Objective:  There were no vitals filed for this visit. There is no height or weight on file to calculate BMI. Constitutional Well developed. Well nourished.  Vascular Dorsalis pedis pulses palpable bilaterally. Posterior tibial pulses palpable bilaterally. Capillary refill normal to all digits.  No cyanosis or clubbing noted. Pedal hair growth normal.  Neurologic Normal speech. Oriented to person, place, and time. Epicritic sensation to light touch grossly present bilaterally.  Dermatologic Nails well groomed and normal in appearance. No open wounds. No skin lesions.  Orthopedic: Normal joint ROM without pain or crepitus bilaterally. No visible deformities. Mild tender to palpation at the calcaneal tuber  right side.  No pain on palpation to the left side. No pain with calcaneal squeeze bilaterally. Ankle ROM diminished range of motion bilaterally. Silfverskiold Test: positive bilaterally.   Radiographs:   Assessment:   1. Plantar fasciitis of right foot   2. Pes planus of both feet  Plan:  Patient was evaluated and treated and all questions answered.  Plantar Fasciitis, bilaterally( primarily right, left side resolved) -I will hold off on the injection given that they have decreased the amount of it she is having.  She still has mild pain to both of the heel.  Patient agrees with this plan. -Patient can start transitioning away from plantar fascial braces as she is now being managed with custom-made orthotics -I will hold off on any further injection.  Given that patient's  pain has progressed and is not being managed with orthotics alone I believe patient will benefit from MRI evaluation to assess for plantar fascial tear -Patient may need endoscopic plantar fasciotomy and a possible gastrocnemius recession if the MRI is positive.  Pes planus deformity -I explained to the patient the etiology and various conservative treatment options available for treating pes planus deformity.  I explained to her that plantar fasciitis is a result of bilateral pes planus.  I believe that patient will benefit from custom-made orthotics to help address the hindfoot calcaneal valgus and supporting arch. -Patient has been obtained orthotics and has been wearing them without any acute complaints.  No follow-ups on file.

## 2020-01-21 NOTE — Telephone Encounter (Signed)
-----   Message from Felipa Furnace, DPM sent at 01/21/2020  7:46 AM EDT ----- Regarding: MRI right heel Hi Jaye Polidori,  Would you be able to order an MRI of the right heel to evaluate plantar fascial tear.  Thank you let me know if you need anything else

## 2020-01-25 ENCOUNTER — Telehealth: Payer: Self-pay | Admitting: *Deleted

## 2020-01-25 ENCOUNTER — Telehealth: Payer: Self-pay

## 2020-01-25 NOTE — Telephone Encounter (Signed)
Patient is requesting something else be call in to help with not being able to sleep while taking steroids(Medrol Dosepak) prescribed by Dr Posey Pronto or he can recommend something that her Psychiatrist may prescribe for a week.

## 2020-01-25 NOTE — Telephone Encounter (Signed)
I explained to pt our doctors do not have formulary for sleeping medications and she should contact her psychiatrist or PCP. Pt states understanding.

## 2020-02-18 ENCOUNTER — Ambulatory Visit: Payer: BC Managed Care – PPO | Admitting: Podiatry

## 2020-02-20 NOTE — Progress Notes (Deleted)
[facility] [address] [place], [place] [address] [phone]  Date:  02/23/2020   Name:  Selena Chapman   DOB:  April 28, 1971   MRN:  [id number]  PCP:  Wendall Mola, NP    Chief Complaint: No chief complaint on file.   History of Present Illness:  Selena Chapman is a 49 y.o. very pleasant female patient who presents with the following:  Here today as a new patient to establish care She has been a patient at [facility]  Patient Active Problem List   Diagnosis Date Noted  . Physical exam, annual 06/03/2019  . Bipolar disorder ([place]) 06/03/2019  . 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 Acquired hypothyroidism 12/29/2017  . Family history of colon cancer 12/29/2017  . Cough 10/10/2017  . History of asthma 10/10/2017  . History of vitamin D deficiency 08/11/2014  . Hyperlipidemia 08/11/2014  . Obesity 08/11/2014  . Asthma 09/23/2011  . Environmental allergies 09/23/2011  . Depression     Past Medical History:  Diagnosis Date  . Allergy   . Asthma   . Bipolar disorder ([place]) 06/03/2019  . Depression   . Physical exam, annual 06/03/2019    Past Surgical History:  Procedure Laterality Date  . APPENDECTOMY      Social History   Tobacco Use  . Smoking status: Never Smoker  . Smokeless tobacco: Never Used  Substance Use Topics  . Alcohol use: No  . Drug use: No    Family History  Problem Relation Age of Onset  . Hypertension Mother   . Depression Sister     No Known Allergies  Medication list has been reviewed and updated.  Current Outpatient Medications on File Prior to Visit  Medication Sig Dispense Refill  . carbamazepine (TEGRETOL) 200 MG tablet Take 200 mg by mouth daily.     . clonazePAM (KLONOPIN) 1 MG tablet Take 1 tablet (1 mg total) by mouth 3 (three) times daily as needed for anxiety. for anxiety (Patient taking differently: Take 1 mg by mouth as needed for anxiety.  for anxiety) 90 tablet 1  . D3-1000 25 MCG (1000 UT) capsule Take 1,000 Units by mouth daily.    Marland Kitchen lamoTRIgine (LAMICTAL) 200 MG tablet Take 400 mg by mouth at bedtime.  12  . methylPREDNISolone (MEDROL DOSEPAK) 4 MG TBPK tablet Use as directed 1 each 0  . metroNIDAZOLE (FLAGYL) 500 MG tablet Take 1 tablet (500 mg total) by mouth 3 (three) times daily. 21 tablet 0  . Norethindrone Acet-Ethinyl Est (AUROVELA 1.5/30 PO)     . norethindrone-ethinyl estradiol-iron (BLISOVI FE 1.5/30) 1.5-30 MG-MCG tablet Take 1 tablet by mouth daily. 90 tablet 1  . PROAIR HFA 108 (90 Base) MCG/ACT inhaler INHALE 2 PUFFS BY MOUTH EVERY 4 HOURS AS NEEDED 8.5 g 0   No current facility-administered medications on file prior to visit.    Review of Systems:  ***  Physical Examination: There were no vitals filed for this visit. There were no vitals filed for this visit. There is no height or weight on file to calculate BMI. Ideal Body Weight:    GEN: no acute distress. HEENT: Atraumatic, Normocephalic.  Ears and Nose: No external deformity. CV: RRR, No M/G/R. No JVD. No thrill. No extra heart sounds. PULM: CTA B, no wheezes, crackles, rhonchi. No retractions. No resp. distress. No accessory muscle use. ABD: S, NT, ND, +BS. No rebound. No HSM. EXTR:  No c/c/e PSYCH: Normally interactive. Conversant.    Assessment and Plan: *** This visit occurred during the SARS-CoV-2 public health emergency.  Safety protocols were in place, including screening questions prior to the visit, additional usage of staff PPE, and extensive cleaning of exam room while observing appropriate contact time as indicated for disinfecting solutions.    Signed Lamar Blinks, MD

## 2020-02-22 ENCOUNTER — Other Ambulatory Visit: Payer: Self-pay

## 2020-02-22 ENCOUNTER — Ambulatory Visit
Admission: RE | Admit: 2020-02-22 | Discharge: 2020-02-22 | Disposition: A | Discharge status: Home / Self Care | Payer: BC Managed Care – PPO | Source: Ambulatory Visit | Attending: Podiatry | Admitting: Podiatry

## 2020-02-22 DIAGNOSIS — M65871 Other synovitis and tenosynovitis, right ankle and foot: Secondary | ICD-10-CM | POA: Diagnosis not present

## 2020-02-22 DIAGNOSIS — M722 Plantar fascial fibromatosis: Secondary | ICD-10-CM | POA: Diagnosis not present

## 2020-02-22 DIAGNOSIS — T148XXA Other injury of unspecified body region, initial encounter: Secondary | ICD-10-CM

## 2020-02-23 ENCOUNTER — Ambulatory Visit: Payer: BC Managed Care – PPO | Admitting: Family Medicine

## 2020-02-25 ENCOUNTER — Other Ambulatory Visit: Payer: Self-pay

## 2020-02-25 ENCOUNTER — Ambulatory Visit: Payer: BC Managed Care – PPO | Admitting: Podiatry

## 2020-02-25 DIAGNOSIS — M722 Plantar fascial fibromatosis: Secondary | ICD-10-CM

## 2020-02-29 ENCOUNTER — Encounter: Payer: Self-pay | Admitting: Podiatry

## 2020-02-29 NOTE — Progress Notes (Signed)
Subjective:  Patient ID: Selena Chapman, female    DOB: [birth year],  MRN: [id number]  Chief Complaint  Patient presents with  . Foot Pain    pt is here for MRI    49 y.o. female presents with the above complaint.  Patient is following up for bilateral plantar fasciitis with left greater than right.  Patient states the left side is getting a little bit better.  She still has a lot of pain and has not gotten any better over time.  Patient states that she had a roller [place] accident years ago that she just recall that could have led to worsening of this pain.  Patient had an MRI done to the right foot which were reviewed by me in extensive detail.  She denies any other acute complaints.    Review of Systems: Negative except as noted in the HPI. Denies N/V/F/Ch.  Past Medical History:  Diagnosis Date  . Allergy   . Asthma   . Bipolar disorder ([place]) 06/03/2019  . Depression   . Physical exam, annual 06/03/2019    Current Outpatient Medications:  .  carbamazepine (TEGRETOL) 200 MG tablet, Take 200 mg by mouth daily. , Disp: , Rfl:  .  D3-1000 25 MCG (1000 UT) capsule, Take 1,000 Units by mouth daily., Disp: , Rfl:  .  lamoTRIgine (LAMICTAL) 200 MG tablet, Take 400 mg by mouth at bedtime., Disp: , Rfl: 12 .  Norethindrone Acet-Ethinyl Est (AUROVELA 1.5/30 PO), , Disp: , Rfl:  .  norethindrone-ethinyl estradiol-iron (BLISOVI FE 1.5/30) 1.5-30 MG-MCG tablet, Take 1 tablet by mouth daily., Disp: 90 tablet, Rfl: 1 .  PROAIR HFA 108 (90 Base) MCG/ACT inhaler, INHALE 2 PUFFS BY MOUTH EVERY 4 HOURS AS NEEDED, Disp: 8.5 g, Rfl: 0 .  clonazePAM (KLONOPIN) 1 MG tablet, Take 1 tablet (1 mg total) by mouth 3 (three) times daily as needed for anxiety. for anxiety (Patient taking differently: Take 1 mg by mouth as needed for anxiety. for anxiety), Disp: 90 tablet, Rfl: 1 .  methylPREDNISolone (MEDROL DOSEPAK) 4 MG TBPK tablet, Use as directed, Disp: 1 each, Rfl: 0  Social History   Tobacco Use  Smoking  Status Never Smoker  Smokeless Tobacco Never Used    No Known Allergies Objective:  There were no vitals filed for this visit. There is no height or weight on file to calculate BMI. Constitutional Well developed. Well nourished.  Vascular Dorsalis pedis pulses palpable bilaterally. Posterior tibial pulses palpable bilaterally. Capillary refill normal to all digits.  No cyanosis or clubbing noted. Pedal hair growth normal.  Neurologic Normal speech. Oriented to person, place, and time. Epicritic sensation to light touch grossly present bilaterally.  Dermatologic Nails well groomed and normal in appearance. No open wounds. No skin lesions.  Orthopedic: Normal joint ROM without pain or crepitus bilaterally. No visible deformities. Mild tender to palpation at the calcaneal tuber  right side.  No pain on palpation to the left side. No pain with calcaneal squeeze bilaterally. Ankle ROM diminished range of motion bilaterally. Silfverskiold Test: positive bilaterally.   Radiographs:   Assessment:   1. Plantar fasciitis of right foot   2. Plantar fasciitis, left    Plan:  Patient was evaluated and treated and all questions answered.  Plantar Fasciitis, bilaterally( primarily right, left side resolved) -I will hold off on the injection given that they have decreased the amount of it she is having.  She still has mild pain to both of the heel.  Patient agrees  with this plan. -I will hold off on any further injection.   MRI reviewed with the patient in extensive detail which was positive for plantar fasciitis. -I believe patient will benefit from EPAT therapy as she still continues to have pain and she does not want to undergo surgical intervention at this time.  If there is no improvement will consider surgical release of the band at that time. -Should be scheduled to see Caryl Pina for EPAT therapy.  Pes planus deformity -I explained to the patient the etiology and various  conservative treatment options available for treating pes planus deformity.  I explained to her that plantar fasciitis is a result of bilateral pes planus.  I believe that patient will benefit from custom-made orthotics to help address the hindfoot calcaneal valgus and supporting arch. -Patient has been obtained orthotics and has been wearing them without any acute complaints.  No follow-ups on file.

## 2020-03-06 ENCOUNTER — Other Ambulatory Visit: Payer: Self-pay

## 2020-03-06 ENCOUNTER — Telehealth: Payer: Self-pay | Admitting: Adult Health Nurse Practitioner

## 2020-03-06 DIAGNOSIS — F410 Panic disorder [episodic paroxysmal anxiety] without agoraphobia: Secondary | ICD-10-CM | POA: Diagnosis not present

## 2020-03-06 DIAGNOSIS — F3111 Bipolar disorder, current episode manic without psychotic features, mild: Secondary | ICD-10-CM | POA: Diagnosis not present

## 2020-03-06 DIAGNOSIS — F3131 Bipolar disorder, current episode depressed, mild: Secondary | ICD-10-CM | POA: Diagnosis not present

## 2020-03-06 NOTE — Telephone Encounter (Signed)
What is the name of the medication? norethindrone-ethinyl estradiol-iron (BLISOVI FE 1.5/30) 1.5-30 MG-MCG tablet [[id number]]    Have you contacted your pharmacy to request a refill? Yes she needs an ov. She is establishing care with Dr. Lorelei Pont in august. She would like a month refill to last her until she sees [facility].   Which pharmacy would you like this sent to? Pharmacy  [facility] [phone] [place], [place] - [place]  [place], [place] [address]  Phone:  [phone] Fax:  [phone]       Patient notified that their request is being sent to the clinical staff for review and that they should receive a call once it is complete. If they do not receive a call within 72 hours they can check with their pharmacy or our office.

## 2020-04-04 NOTE — Progress Notes (Addendum)
[facility] [address], [place], [place] [address] [phone] [id number]  Date:  04/06/2020   Name:  Selena Chapman   DOB:  Mar 07, 1971   MRN:  [id number]  PCP:  Darreld Mclean, MD    Chief Complaint: New Patient (Initial Visit) (lab work, sed rate, vitain levels, med refills), Perimenopause (abnormal pap in past? ), and Plantar Fasciitis (bilateral, right foot worse)   History of Present Illness:  Selena Chapman is a 49 y.o. very pleasant female patient who presents with the following:  Here today as a new patient to establish care She lost her last PCP- they left the practice   Pap is up-to-date- however she had a positive HPV in 2019 that has not been followed up as of yet  Mammo up-to-date.  Her most recent pap did not include HPV  Labs-  Would like to update today, she is fasting  COVID-19 series- complete She has bipolar disorder/ cyclothymia; she sees DR Toy Care and she would like labs faxed to her- CBC and CMP Colon cancer screening- she has an aunt with CC but no other family history, no blood in stool Will order cologuard   She lost 80 lbs recently, she did a lot of running last year and then got plantar fascitis.  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 Physical exam, annual 06/03/2019  . Bipolar disorder ([place]) 06/03/2019  . 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 Acquired hypothyroidism 12/29/2017  . Family history of colon cancer 12/29/2017  . Cough 10/10/2017  . History of asthma 10/10/2017  . History of vitamin D deficiency 08/11/2014  . Hyperlipidemia 08/11/2014  . Obesity 08/11/2014  . Asthma 09/23/2011  .  Environmental allergies 09/23/2011  . Depression     Past Medical History:  Diagnosis Date  . Allergy   . Anxiety   . Asthma   . Bipolar affect, depressed ([place])   . Bipolar disorder ([place]) 06/03/2019  . Depression   . Physical exam, annual 06/03/2019  . Plantar fasciitis     Past Surgical History:  Procedure Laterality Date  . APPENDECTOMY      Social History   Tobacco Use  . Smoking status: Never Smoker  . Smokeless tobacco: Never Used  Substance Use Topics  . Alcohol use: No  . Drug use: No    Family History  Problem Relation Age of Onset  . Hypertension Mother   . Depression Sister     No Known Allergies  Medication list has been reviewed and updated.  Current Outpatient Medications on File Prior to Visit  Medication Sig Dispense Refill  . carbamazepine (TEGRETOL) 200 MG tablet Take 200 mg by mouth daily.     . D3-1000 25 MCG (1000 UT) capsule Take 1,000 Units by mouth daily.    Marland Kitchen lamoTRIgine (LAMICTAL) 200 MG tablet Take 400 mg by mouth at bedtime.  12  . Norethindrone Acet-Ethinyl Est (AUROVELA 1.5/30 PO)     . PROAIR HFA 108 (90 Base) MCG/ACT inhaler INHALE 2 PUFFS BY MOUTH EVERY 4 HOURS AS NEEDED 8.5 g 0  . clonazePAM (  KLONOPIN) 1 MG tablet Take 1 tablet (1 mg total) by mouth 3 (three) times daily as needed for anxiety. for anxiety (Patient taking differently: Take 1 mg by mouth as needed for anxiety. for anxiety) 90 tablet 1   No current facility-administered medications on file prior to visit.    Review of Systems:  As per HPI- otherwise negative.   Physical Examination: Vitals:   04/06/20 0824  BP: 110/70  Pulse: 72  Resp: 17  SpO2: 98%   Vitals:   04/06/20 0824  Weight: 171 lb (77.6 kg)  Height: 5' 3.5" (1.613 m)   Body mass index is 29.82 kg/m. Ideal Body Weight: Weight in (lb) to have BMI = 25: 143.1  GEN: no acute distress.  Well appearing, overweight  HEENT: Atraumatic, Normocephalic.   PEERL, TM wnl bilateally  Ears and Nose:  No external deformity. CV: RRR, No M/G/R. No JVD. No thrill. No extra heart sounds. PULM: CTA B, no wheezes, crackles, rhonchi. No retractions. No resp. distress. No accessory muscle use. ABD: S, NT, ND. No rebound. No HSM. EXTR: No c/c/e PSYCH: Normally interactive. Conversant.    Assessment and Plan: Encounter for medical examination to establish care  Vitamin D deficiency - Plan: VITAMIN D 25 Hydroxy (Vit-D Deficiency, Fractures)  Colon cancer screening  Pain of foot, unspecified laterality - Plan: Sedimentation rate  Screening for deficiency anemia - Plan: CBC  Screening for diabetes mellitus - Plan: Comprehensive metabolic panel, Hemoglobin A1c  Medication monitoring encounter - Plan: CBC, Comprehensive metabolic panel  Acquired hypothyroidism - Plan: TSH  Screening for hyperlipidemia - Plan: Lipid panel  Establish care today Labs pending as above Order cologuard Send labs to Dr Toy Care She will see me soon for her pap Will plan further follow- up pending labs.  This visit occurred during the SARS-CoV-2 public health emergency.  Safety protocols were in place, including screening questions prior to the visit, additional usage of staff PPE, and extensive cleaning of exam room while observing appropriate contact time as indicated for disinfecting solutions.    Signed Lamar Blinks, MD  Received her labs as below, message to patient  Results for orders placed or performed in visit on 04/06/20  CBC  Result Value Ref Range   WBC 4.5 4.0 - 10.5 K/uL   RBC 3.90 3.87 - 5.11 Mil/uL   Platelets 208.0 150 - 400 K/uL   Hemoglobin 12.7 12.0 - 15.0 g/dL   HCT 36.9 36 - 46 %   MCV 94.7 78.0 - 100.0 fl   MCHC 34.4 30.0 - 36.0 g/dL   RDW 12.9 11.5 - 15.5 %  Comprehensive metabolic panel  Result Value Ref Range   Sodium 139 135 - 145 mEq/L   Potassium 4.3 3.5 - 5.1 mEq/L   Chloride 106 96 - 112 mEq/L   CO2 28 19 - 32 mEq/L   Glucose, Bld 79 70 - 99 mg/dL   BUN 14 6 - 23  mg/dL   Creatinine, Ser 0.84 0.40 - 1.20 mg/dL   Total Bilirubin 0.3 0.2 - 1.2 mg/dL   Alkaline Phosphatase 40 39 - 117 U/L   AST 15 0 - 37 U/L   ALT 14 0 - 35 U/L   Total Protein 6.0 6.0 - 8.3 g/dL   Albumin 3.8 3.5 - 5.2 g/dL   GFR 72.56 >60.00 mL/min   Calcium 8.5 8.4 - 10.5 mg/dL  Hemoglobin A1c  Result Value Ref Range   Hgb A1c MFr Bld 4.9 4.6 - 6.5 %  Lipid panel  Result Value Ref Range   Cholesterol 272 (H) 0 - 200 mg/dL   Triglycerides 83.0 0 - 149 mg/dL   HDL 79.90 >39.00 mg/dL   VLDL 16.6 0.0 - 40.0 mg/dL   LDL Cholesterol 176 (H) 0 - 99 mg/dL   Total CHOL/HDL Ratio 3    NonHDL 192.33   TSH  Result Value Ref Range   TSH 4.93 (H) 0.35 - 4.50 uIU/mL  Sedimentation rate  Result Value Ref Range   Sed Rate 4 0 - 20 mm/hr  VITAMIN D 25 Hydroxy (Vit-D Deficiency, Fractures)  Result Value Ref Range   VITD 28.36 (L) 30.00 - 100.00 ng/mL   Blood counts are normal Metabolic profile looks fine A1c is normal, no sign of diabetes Your cholesterol shows elevated LDL, otherwise favorable.  Your HDL (good cholesterol) is high, so your total to HDL ratio is good We can continue to monitor cholesterol for now Sedimentation rate is normal Your vitamin D is minimally low.  I would suggest starting on an over-the-counter supplement with 2000 international units daily TSH is a bit high, I would like to get you back on thyroid medication.  Do you recall what dose you were taking previously?

## 2020-04-06 ENCOUNTER — Ambulatory Visit: Payer: BC Managed Care – PPO | Admitting: Family Medicine

## 2020-04-06 ENCOUNTER — Encounter: Payer: Self-pay | Admitting: Family Medicine

## 2020-04-06 ENCOUNTER — Other Ambulatory Visit: Payer: Self-pay

## 2020-04-06 VITALS — BP 110/70 | HR 72 | Resp 17 | Ht 63.5 in | Wt 171.0 lb

## 2020-04-06 DIAGNOSIS — E559 Vitamin D deficiency, unspecified: Secondary | ICD-10-CM | POA: Diagnosis not present

## 2020-04-06 DIAGNOSIS — Z131 Encounter for screening for diabetes mellitus: Secondary | ICD-10-CM

## 2020-04-06 DIAGNOSIS — E039 Hypothyroidism, unspecified: Secondary | ICD-10-CM

## 2020-04-06 DIAGNOSIS — Z1211 Encounter for screening for malignant neoplasm of colon: Secondary | ICD-10-CM | POA: Diagnosis not present

## 2020-04-06 DIAGNOSIS — Z130 Encounter for screening for diseases of the blood and blood-forming organs and certain disorders involving the immune mechanism: Secondary | ICD-10-CM | POA: Diagnosis not present

## 2020-04-06 DIAGNOSIS — Z5181 Encounter for therapeutic drug level monitoring: Secondary | ICD-10-CM

## 2020-04-06 DIAGNOSIS — Z0000 Encounter for general adult medical examination without abnormal findings: Secondary | ICD-10-CM

## 2020-04-06 DIAGNOSIS — Z13220 Encounter for screening for lipoid disorders: Secondary | ICD-10-CM

## 2020-04-06 DIAGNOSIS — M79673 Pain in unspecified foot: Secondary | ICD-10-CM

## 2020-04-06 LAB — COMPREHENSIVE METABOLIC PANEL
ALT: 14 U/L (ref 0–35)
AST: 15 U/L (ref 0–37)
Albumin: 3.8 g/dL (ref 3.5–5.2)
Alkaline Phosphatase: 40 U/L (ref 39–117)
BUN: 14 mg/dL (ref 6–23)
CO2: 28 mEq/L (ref 19–32)
Calcium: 8.5 mg/dL (ref 8.4–10.5)
Chloride: 106 mEq/L (ref 96–112)
Creatinine, Ser: 0.84 mg/dL (ref 0.40–1.20)
GFR: 72.56 mL/min (ref >60.00)
Glucose, Bld: 79 mg/dL (ref 70–99)
Potassium: 4.3 mEq/L (ref 3.5–5.1)
Sodium: 139 mEq/L (ref 135–145)
Total Bilirubin: 0.3 mg/dL (ref 0.2–1.2)
Total Protein: 6 g/dL (ref 6.0–8.3)

## 2020-04-06 LAB — LIPID PANEL
Cholesterol: 272 mg/dL — ABNORMAL HIGH (ref 0–200)
HDL: 79.9 mg/dL (ref >39.00)
LDL Cholesterol: 176 mg/dL — ABNORMAL HIGH (ref 0–99)
NonHDL: 192.33
Total CHOL/HDL Ratio: 3
Triglycerides: 83 mg/dL (ref 0.0–149.0)
VLDL: 16.6 mg/dL (ref 0.0–40.0)

## 2020-04-06 LAB — TSH: TSH: 4.93 u[IU]/mL — ABNORMAL HIGH (ref 0.35–4.50)

## 2020-04-06 LAB — CBC
HCT: 36.9 % (ref 36.0–46.0)
Hemoglobin: 12.7 g/dL (ref 12.0–15.0)
MCHC: 34.4 g/dL (ref 30.0–36.0)
MCV: 94.7 fl (ref 78.0–100.0)
Platelets: 208 10*3/uL (ref 150.0–400.0)
RBC: 3.9 Mil/uL (ref 3.87–5.11)
RDW: 12.9 % (ref 11.5–15.5)
WBC: 4.5 10*3/uL (ref 4.0–10.5)

## 2020-04-06 LAB — VITAMIN D 25 HYDROXY (VIT D DEFICIENCY, FRACTURES): VITD: 28.36 ng/mL — ABNORMAL LOW (ref 30.00–100.00)

## 2020-04-06 LAB — SEDIMENTATION RATE: Sed Rate: 4 mm/hr (ref 0–20)

## 2020-04-06 LAB — HEMOGLOBIN A1C: Hgb A1c MFr Bld: 4.9 % (ref 4.6–6.5)

## 2020-04-06 NOTE — Patient Instructions (Addendum)
It was very nice to meet you today!  I will be in touch with your labs asap Please see me at your convenience to do your pap   Health Maintenance, Female Adopting a healthy lifestyle and getting preventive care are important in promoting health and wellness. Ask your health care provider about:  The right schedule for you to have regular tests and exams.  Things you can do on your own to prevent diseases and keep yourself healthy. What should I know about diet, weight, and exercise? Eat a healthy diet   Eat a diet that includes plenty of vegetables, fruits, low-fat dairy products, and lean protein.  Do not eat a lot of foods that are high in solid fats, added sugars, or sodium. Maintain a healthy weight Body mass index (BMI) is used to identify weight problems. It estimates body fat based on height and weight. Your health care provider can help determine your BMI and help you achieve or maintain a healthy weight. Get regular exercise Get regular exercise. This is one of the most important things you can do for your health. Most adults should:  Exercise for at least 150 minutes each week. The exercise should increase your heart rate and make you sweat (moderate-intensity exercise).  Do strengthening exercises at least twice a week. This is in addition to the moderate-intensity exercise.  Spend less time sitting. Even light physical activity can be beneficial. Watch cholesterol and blood lipids Have your blood tested for lipids and cholesterol at 47 years of age, then have this test every 5 years. Have your cholesterol levels checked more often if:  Your lipid or cholesterol levels are high.  You are older than 47 years of age.  You are at high risk for heart disease. What should I know about cancer screening? Depending on your health history and family history, you may need to have cancer screening at various ages. This may include screening for:  Breast cancer.  Cervical  cancer.  Colorectal cancer.  Skin cancer.  Lung cancer. What should I know about heart disease, diabetes, and high blood pressure? Blood pressure and heart disease  High blood pressure causes heart disease and increases the risk of stroke. This is more likely to develop in people who have high blood pressure readings, are of African descent, or are overweight.  Have your blood pressure checked: ? Every 3-5 years if you are 42-31 years of age. ? Every year if you are 26 years old or older. Diabetes Have regular diabetes screenings. This checks your fasting blood sugar level. Have the screening done:  Once every three years after age 53 if you are at a normal weight and have a low risk for diabetes.  More often and at a younger age if you are overweight or have a high risk for diabetes. What should I know about preventing infection? Hepatitis B If you have a higher risk for hepatitis B, you should be screened for this virus. Talk with your health care provider to find out if you are at risk for hepatitis B infection. Hepatitis C Testing is recommended for:  Everyone born from [birth year] through 1963.  Anyone with known risk factors for hepatitis C. Sexually transmitted infections (STIs)  Get screened for STIs, including gonorrhea and chlamydia, if: ? You are sexually active and are younger than 47 years of age. ? You are older than 47 years of age and your health care provider tells you that you are at risk for this  type of infection. ? Your sexual activity has changed since you were last screened, and you are at increased risk for chlamydia or gonorrhea. Ask your health care provider if you are at risk.  Ask your health care provider about whether you are at high risk for HIV. Your health care provider may recommend a prescription medicine to help prevent HIV infection. If you choose to take medicine to prevent HIV, you should first get tested for HIV. You should then be tested every 3  months for as long as you are taking the medicine. Pregnancy  If you are about to stop having your period (premenopausal) and you may become pregnant, seek counseling before you get pregnant.  Take 400 to 800 micrograms (mcg) of folic acid every day if you become pregnant.  Ask for birth control (contraception) if you want to prevent pregnancy. Osteoporosis and menopause Osteoporosis is a disease in which the bones lose minerals and strength with aging. This can result in bone fractures. If you are 70 years old or older, or if you are at risk for osteoporosis and fractures, ask your health care provider if you should:  Be screened for bone loss.  Take a calcium or vitamin D supplement to lower your risk of fractures.  Be given hormone replacement therapy (HRT) to treat symptoms of menopause. Follow these instructions at home: Lifestyle  Do not use any products that contain nicotine or tobacco, such as cigarettes, e-cigarettes, and chewing tobacco. If you need help quitting, ask your health care provider.  Do not use street drugs.  Do not share needles.  Ask your health care provider for help if you need support or information about quitting drugs. Alcohol use  Do not drink alcohol if: ? Your health care provider tells you not to drink. ? You are pregnant, may be pregnant, or are planning to become pregnant.  If you drink alcohol: ? Limit how much you use to 0-1 drink a day. ? Limit intake if you are breastfeeding.  Be aware of how much alcohol is in your drink. In the [place] one drink equals one 12 oz bottle of beer (355 mL), one 5 oz glass of wine (148 mL), or one 1 oz glass of hard liquor (44 mL). General instructions  Schedule regular health, dental, and eye exams.  Stay current with your vaccines.  Tell your health care provider if: ? You often feel depressed. ? You have ever been abused or do not feel safe at home. Summary  Adopting a healthy lifestyle and getting  preventive care are important in promoting health and wellness.  Follow your health care provider's instructions about healthy diet, exercising, and getting tested or screened for diseases.  Follow your health care provider's instructions on monitoring your cholesterol and blood pressure. This information is not intended to replace advice given to you by your health care provider. Make sure you discuss any questions you have with your health care provider. Document Revised: 08/05/2018 Document Reviewed: 08/05/2018 Elsevier Patient Education  2020 [employer].

## 2020-04-06 NOTE — Progress Notes (Signed)
Cologuard ordered for patient.

## 2020-04-07 ENCOUNTER — Ambulatory Visit (INDEPENDENT_AMBULATORY_CARE_PROVIDER_SITE_OTHER): Payer: Self-pay | Admitting: *Deleted

## 2020-04-07 DIAGNOSIS — M722 Plantar fascial fibromatosis: Secondary | ICD-10-CM

## 2020-04-07 MED ORDER — LEVOTHYROXINE SODIUM 25 MCG PO TABS: 25.0000 ug | ORAL_TABLET | Freq: Every day | ORAL | 2 refills | Status: DC

## 2020-04-07 NOTE — Progress Notes (Signed)
Patient presents for the 1st EPAT treatment today with complaint of plantar heel pain right. Diagnosed with plantar fasciitis by Dr. Posey Pronto. This has been ongoing for several months. The patient has tried ice, stretching, NSAIDS and supportive shoe gear with no long term relief.   Most of the pain is located plantar heel and around the sides of the heel.  ESWT administered and tolerated well.Treatment settings initiated at:   Energy: 20   Ended treatment session today with 3000 shocks at the following settings:   Energy: 20  Frequency: 5.0  Joules: 19.62  Arch massager was utilized x 3 rounds  Advised to avoid ice and NSAIDs throughout the treatment process and to utilize boot or supportive shoes for at least the next 3 days.  Follow up for 2nd treatment in 1 week.

## 2020-04-07 NOTE — Addendum Note (Signed)
Addended by: Lamar Blinks C on: 04/07/2020 06:52 AM   Modules accepted: Orders

## 2020-04-07 NOTE — Patient Instructions (Signed)

## 2020-04-17 ENCOUNTER — Other Ambulatory Visit: Payer: Self-pay

## 2020-04-17 ENCOUNTER — Ambulatory Visit (INDEPENDENT_AMBULATORY_CARE_PROVIDER_SITE_OTHER): Payer: BC Managed Care – PPO | Admitting: *Deleted

## 2020-04-17 DIAGNOSIS — B351 Tinea unguium: Secondary | ICD-10-CM

## 2020-04-17 DIAGNOSIS — M722 Plantar fascial fibromatosis: Secondary | ICD-10-CM

## 2020-04-17 NOTE — Progress Notes (Signed)
Patient presents for the 2nd EPAT treatment today with complaint of plantar heel pain right. Diagnosed with plantar fasciitis by Dr. Posey Pronto. This has been ongoing for several months. The patient has tried ice, stretching, NSAIDS and supportive shoe gear with no long term relief.   Most of the pain is located plantar heel and around the sides of the heel. She states that it has been really sore. She's having some pain around the base of the 5th met as well. She does say that a few mornings have felt some better.  ESWT administered and tolerated well.Treatment settings initiated at:   Energy: 25   Ended treatment session today with 3000 shocks at the following settings:   Energy: 25  Frequency: 4.0  Joules: 24.52  Arch massager was utilized x 3 rounds  Advised to avoid ice and NSAIDs throughout the treatment process and to utilize boot or supportive shoes for at least the next 3 days.  Follow up for 3rd treatment in 1 week.

## 2020-04-20 ENCOUNTER — Ambulatory Visit: Payer: BC Managed Care – PPO | Admitting: Family Medicine

## 2020-04-27 ENCOUNTER — Ambulatory Visit (INDEPENDENT_AMBULATORY_CARE_PROVIDER_SITE_OTHER): Payer: Self-pay | Admitting: *Deleted

## 2020-04-27 ENCOUNTER — Other Ambulatory Visit: Payer: Self-pay

## 2020-04-27 DIAGNOSIS — Z1211 Encounter for screening for malignant neoplasm of colon: Secondary | ICD-10-CM | POA: Diagnosis not present

## 2020-04-27 DIAGNOSIS — M722 Plantar fascial fibromatosis: Secondary | ICD-10-CM

## 2020-04-27 LAB — COLOGUARD: Cologuard: NEGATIVE

## 2020-04-27 NOTE — Progress Notes (Signed)
Patient presents for the 2nd EPAT treatment today with complaint of plantar heel pain right. Diagnosed with plantar fasciitis by Dr. Posey Pronto. This has been ongoing for several months. The patient has tried ice, stretching, NSAIDS and supportive shoe gear with no long term relief.   Most of the pain is located plantar heel and around the sides of the heel. She has no direct plantar heel pain anymore, more of her pain in lateral heel and around the base of the 5th met. She is not having any morning pain either.  ESWT administered and tolerated well.Treatment settings initiated at:   Energy: 30   Ended treatment session today with 3000 shocks at the following settings:   Energy: 30  Frequency: 4.0  Joules: 29.43  Arch massager was utilized x 3 rounds  Advised to avoid ice and NSAIDs throughout the treatment process and to utilize boot or supportive shoes for at least the next 3 days.  Follow up for 4th treatment in 2 weeks.

## 2020-05-02 DIAGNOSIS — F411 Generalized anxiety disorder: Secondary | ICD-10-CM | POA: Diagnosis not present

## 2020-05-02 DIAGNOSIS — F3174 Bipolar disorder, in full remission, most recent episode manic: Secondary | ICD-10-CM | POA: Diagnosis not present

## 2020-05-02 DIAGNOSIS — F3176 Bipolar disorder, in full remission, most recent episode depressed: Secondary | ICD-10-CM | POA: Diagnosis not present

## 2020-05-04 ENCOUNTER — Other Ambulatory Visit: Payer: BC Managed Care – PPO

## 2020-05-04 ENCOUNTER — Encounter: Payer: Self-pay | Admitting: Family Medicine

## 2020-05-04 LAB — COLOGUARD: COLOGUARD: NEGATIVE

## 2020-05-05 ENCOUNTER — Encounter: Payer: Self-pay | Admitting: Family Medicine

## 2020-05-06 NOTE — Progress Notes (Addendum)
[facility] [address] [place], [place] [address] [phone]  Date:  05/08/2020   Name:  Selena Chapman   DOB:  10/18/1970   MRN:  [id number]  PCP:  Darreld Mclean, MD    Chief Complaint: Visit for Pap Smear   History of Present Illness:  Selena Chapman is a 49 y.o. very pleasant female patient who presents with the following:  Patient here today for cervical cancer screening Last seen by myself in August of this year to establish care She did have a positive HPV in 2019 that has not yet been followed up Her TSH was also elevated at last visit-she is now taking levothyroxine 25 mcg  COVID-19 series-she had a single dose of [employer] vaccine earlier this year.  She is still considering her options as far as booster dose or possibly reimmunizing with Pfizer or Moderna   Flu vaccine- done  Patient is having some trouble with plantar fasciitis, this really limits her ability to exercise.  She is frustrated she is not able to lose weight.  She may actually have a plantar fasciotomy next year, when her insurance resets.  We discussed exercise options, she may think about getting a home exercise bicycle Patient Active Problem List   Diagnosis Date Noted  . Physical exam, annual 06/03/2019  . Bipolar disorder ([place]) 06/03/2019  . 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 Acquired hypothyroidism 12/29/2017  . Family history of colon cancer 12/29/2017  . Cough 10/10/2017  . History of asthma 10/10/2017  . History of vitamin D deficiency 08/11/2014  . Hyperlipidemia 08/11/2014  . Obesity 08/11/2014  . Asthma 09/23/2011  . Environmental allergies 09/23/2011  . Depression     Past Medical History:  Diagnosis Date  . Allergy   . Anxiety   . Asthma   . Bipolar affect, depressed ([place])   . Bipolar disorder ([place]) 06/03/2019  . Depression   . Physical exam, annual 06/03/2019  . Plantar fasciitis     Past  Surgical History:  Procedure Laterality Date  . APPENDECTOMY      Social History   Tobacco Use  . Smoking status: Never Smoker  . Smokeless tobacco: Never Used  Substance Use Topics  . Alcohol use: No  . Drug use: No    Family History  Problem Relation Age of Onset  . Hypertension Mother   . Depression Sister     No Known Allergies  Medication list has been reviewed and updated.  Current Outpatient Medications on File Prior to Visit  Medication Sig Dispense Refill  . carbamazepine (TEGRETOL) 200 MG tablet Take 200 mg by mouth daily.     . D3-1000 25 MCG (1000 UT) capsule Take 1,000 Units by mouth daily.    Marland Kitchen lamoTRIgine (LAMICTAL) 200 MG tablet Take 400 mg by mouth at bedtime.  12  . levothyroxine (SYNTHROID) 25 MCG tablet Take 1 tablet (25 mcg total) by mouth daily before breakfast. Increase to 50 mg as directed by MD 60 tablet 2  . Norethindrone Acet-Ethinyl Est (AUROVELA 1.5/30 PO)     . traZODone (DESYREL) 50 MG tablet Take 50 mg by mouth at bedtime. Patient taking half tablet as needed for sleep    . clonazePAM (KLONOPIN) 1 MG tablet Take 1 tablet (1 mg total) by mouth 3 (three) times daily as needed for anxiety. for anxiety (Patient taking differently: Take 1 mg by mouth as needed for  anxiety. for anxiety) 90 tablet 1   No current facility-administered medications on file prior to visit.    Review of Systems:  As per HPI- otherwise negative.   Physical Examination: Vitals:   05/08/20 1002  BP: 122/80  Pulse: 68  Resp: 16  SpO2: 98%   Vitals:   05/08/20 1002  Weight: 174 lb (78.9 kg)  Height: 5' 3.5" (1.613 m)   Body mass index is 30.34 kg/m. Ideal Body Weight: Weight in (lb) to have BMI = 25: 143.1  GEN: no acute distress.  Overweight, otherwise looks well HEENT: Atraumatic, Normocephalic.  Ears and Nose: No external deformity. CV: RRR, No M/G/R. No JVD. No thrill. No extra heart sounds. PULM: CTA B, no wheezes, crackles, rhonchi. No retractions.  No resp. distress. No accessory muscle use.Marland Kitchen EXTR: No c/c/e PSYCH: Normally interactive. Conversant.  Pelvic: normal, no vaginal lesions or discharge. Uterus normal, no CMT, no adnexal tendereness or masses  Wt Readings from Last 3 Encounters:  05/08/20 174 lb (78.9 kg)  04/06/20 171 lb (77.6 kg)  06/29/19 150 lb (68 kg)      Assessment and Plan: Screening for cervical cancer - Plan: Cytology - PAP  Acquired hypothyroidism - Plan: TSH  Weight gain  Collected pap sample today, will be in touch with results Follow-up on thyroid Selena Chapman has not been able to exercise like normal due to plantar fasciitis, she is very frustrated with her weight and has not been able to lose through dietary restriction.  We discussed today, I suggested that she think of an exercise bicycle and she will consider this.  In the meantime, she is following up with the appropriate specialist for her chronic plantar fasciitis This visit occurred during the SARS-CoV-2 public health emergency.  Safety protocols were in place, including screening questions prior to the visit, additional usage of staff PPE, and extensive cleaning of exam room while observing appropriate contact time as indicated for disinfecting solutions.    Signed Lamar Blinks, MD  addnd 9/15- received her TSH, message to pt Need to increase dose of thyroid med   Results for orders placed or performed in visit on 05/08/20  TSH  Result Value Ref Range   TSH 4.78 (H) mIU/L  Cytology - PAP  Result Value Ref Range   High risk HPV Negative    Adequacy      Satisfactory for evaluation; transformation zone component ABSENT.   Diagnosis      - Negative for intraepithelial lesion or malignancy (NILM)   Comment Normal Reference Range HPV - Negative

## 2020-05-07 NOTE — Patient Instructions (Addendum)
It was good to see you again today, I will be in touch with your Pap as soon as possible, and your TSH Think about getting an exercise bike- this may be a good way for you to work out without having to drive anywhere or put pressure on your foot  Take care!

## 2020-05-08 ENCOUNTER — Encounter: Payer: Self-pay | Admitting: Family Medicine

## 2020-05-08 ENCOUNTER — Ambulatory Visit (INDEPENDENT_AMBULATORY_CARE_PROVIDER_SITE_OTHER): Payer: BC Managed Care – PPO | Admitting: Family Medicine

## 2020-05-08 ENCOUNTER — Other Ambulatory Visit (HOSPITAL_COMMUNITY)
Admission: RE | Admit: 2020-05-08 | Discharge: 2020-05-08 | Disposition: A | Discharge status: Home / Self Care | Payer: BC Managed Care – PPO | Source: Ambulatory Visit | Attending: Family Medicine | Admitting: Family Medicine

## 2020-05-08 ENCOUNTER — Other Ambulatory Visit: Payer: Self-pay

## 2020-05-08 VITALS — BP 122/80 | HR 68 | Resp 16 | Ht 63.5 in | Wt 174.0 lb

## 2020-05-08 DIAGNOSIS — R635 Abnormal weight gain: Secondary | ICD-10-CM

## 2020-05-08 DIAGNOSIS — Z124 Encounter for screening for malignant neoplasm of cervix: Secondary | ICD-10-CM

## 2020-05-08 DIAGNOSIS — E039 Hypothyroidism, unspecified: Secondary | ICD-10-CM

## 2020-05-08 LAB — TSH: TSH: 4.78 mIU/L — ABNORMAL HIGH

## 2020-05-09 ENCOUNTER — Encounter: Payer: Self-pay | Admitting: Family Medicine

## 2020-05-09 LAB — CYTOLOGY - PAP
Adequacy: ABSENT
Comment: NEGATIVE
Diagnosis: NEGATIVE
High risk HPV: NEGATIVE

## 2020-05-10 ENCOUNTER — Encounter: Payer: Self-pay | Admitting: Family Medicine

## 2020-05-10 DIAGNOSIS — E039 Hypothyroidism, unspecified: Secondary | ICD-10-CM

## 2020-05-12 ENCOUNTER — Other Ambulatory Visit: Payer: Self-pay

## 2020-05-12 ENCOUNTER — Ambulatory Visit (INDEPENDENT_AMBULATORY_CARE_PROVIDER_SITE_OTHER): Payer: BC Managed Care – PPO | Admitting: *Deleted

## 2020-05-12 DIAGNOSIS — M722 Plantar fascial fibromatosis: Secondary | ICD-10-CM

## 2020-05-12 DIAGNOSIS — M79676 Pain in unspecified toe(s): Secondary | ICD-10-CM

## 2020-05-12 NOTE — Progress Notes (Signed)
Patient presents for the 4th EPAT treatment today with complaint of plantar heel pain right. Diagnosed with plantar fasciitis by Dr. Posey Pronto. This has been ongoing for several months. The patient has tried ice, stretching, NSAIDS and supportive shoe gear with no long term relief.   Most of the pain is located around the sides of the heel. She says she is a little better.  ESWT administered and tolerated well.Treatment settings initiated at:   Energy: 35  Ended treatment session today with 3000 shocks at the following settings:   Energy: 35  Frequency: 4.0  Joules: 34.33  Arch massager was utilized x 3 rounds  Advised to avoid ice and NSAIDs throughout the treatment process and to utilize boot or supportive shoes for at least the next 3 days.  Follow up with Dr. Posey Pronto for re-evaluation in 2 weeks.

## 2020-05-24 ENCOUNTER — Encounter: Payer: Self-pay | Admitting: Family Medicine

## 2020-05-24 NOTE — Progress Notes (Addendum)
[place] at [facility] [place], [place], [place] [address] [phone]  Date:  05/25/2020   Name:  Selena Chapman   DOB:  January 29, 1971   MRN:  [id number]  PCP:  Darreld Mclean, MD    Chief Complaint: Urinary Tract Infection (low grade fever, pressure in lower abdomin, urgency, headache, hesitancy)   History of Present Illness:  Selena Chapman is a 49 y.o. very pleasant female patient who presents with the following:  Patient had contacted me yesterday with concern of possible UTI-made appointment for today  History of hypothyroidism, hyperlipidemia, asthma, bipolar disorder She reports that she was treated for UTI the end of July, but a urine culture was not done.  She was treated with Macrobid- she got back to normal for a while Sx returned about one week ago She notes a feeling of bladder fullness, pressure, urinary frequency, some smell to urine Some burning with passing urine No hematuria Low grade fever at home yesterday-  No back pain, she notes a mild HA   Flu vaccine Covid vaccine complete  No cough or ST She did take a home covid test yesterday which was negative   LMP- she is not sure, her menses are irregular.  She is on OCP    Patient Active Problem List   Diagnosis Date Noted  . Physical exam, annual 06/03/2019  . Bipolar disorder ([place]) 06/03/2019  . 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 Acquired hypothyroidism 12/29/2017  . Family history of colon cancer 12/29/2017  . Cough 10/10/2017  . History of asthma 10/10/2017  . History of vitamin D deficiency 08/11/2014  . Hyperlipidemia 08/11/2014  . Obesity 08/11/2014  . Asthma 09/23/2011  . Environmental allergies 09/23/2011  . Depression     Past Medical History:  Diagnosis Date  . Allergy   . Anxiety   . Asthma   . Bipolar affect, depressed ([place])   . Bipolar disorder ([place]) 06/03/2019  . Depression   . Physical exam, annual 06/03/2019  .  Plantar fasciitis     Past Surgical History:  Procedure Laterality Date  . APPENDECTOMY      Social History   Tobacco Use  . Smoking status: Never Smoker  . Smokeless tobacco: Never Used  Substance Use Topics  . Alcohol use: No  . Drug use: No    Family History  Problem Relation Age of Onset  . Hypertension Mother   . Depression Sister     No Known Allergies  Medication list has been reviewed and updated.  Current Outpatient Medications on File Prior to Visit  Medication Sig Dispense Refill  . carbamazepine (TEGRETOL) 200 MG tablet Take 200 mg by mouth daily.     . D3-1000 25 MCG (1000 UT) capsule Take 1,000 Units by mouth daily.    Marland Kitchen lamoTRIgine (LAMICTAL) 200 MG tablet Take 200 mg by mouth in the morning and at bedtime.     Marland Kitchen levothyroxine (SYNTHROID) 50 MCG tablet Take 50 mcg by mouth daily before breakfast.    . Norethindrone Acet-Ethinyl Est (AUROVELA 1.5/30 PO)     . traZODone (DESYREL) 50 MG tablet Take 50 mg by mouth at bedtime. Patient taking half tablet as needed for sleep    . clonazePAM (KLONOPIN) 1 MG tablet Take 1 tablet (1 mg total) by mouth 3 (three) times daily as needed for anxiety. for anxiety (Patient taking differently: Take 1 mg by mouth as needed for anxiety. for  anxiety) 90 tablet 1   No current facility-administered medications on file prior to visit.    Review of Systems:  As per HPI- otherwise negative.   Physical Examination: Vitals:   05/25/20 1005  BP: 122/78  Pulse: 75  Resp: 16  Temp: 99.1 F (37.3 C)  SpO2: 99%   Vitals:   05/25/20 1005  Weight: 178 lb (80.7 kg)  Height: 5' 3.5" (1.613 m)   Body mass index is 31.04 kg/m. Ideal Body Weight: Weight in (lb) to have BMI = 25: 143.1  GEN: no acute distress.  Overweight, looks well  HEENT: Atraumatic, Normocephalic.  Ears and Nose: No external deformity. CV: RRR, No M/G/R. No JVD. No thrill. No extra heart sounds. PULM: CTA B, no wheezes, crackles, rhonchi. No  retractions. No resp. distress. No accessory muscle use. ABD: S,  ND, +BS. No rebound. No HSM.  No CVA tenderness  mild tenderness over bladder  EXTR: No c/c/e PSYCH: Normally interactive. Conversant.    Assessment and Plan: Acute cystitis without hematuria - Plan: ciprofloxacin (CIPRO) 500 MG tablet  Urinary frequency - Plan: Urine Culture, POCT urinalysis dipstick, POCT urine pregnancy  Acquired hypothyroidism - Plan: TSH  Pt here today with likely UTI- she has a mild fever, will start her on cipro in case pyelo is developing Pt cautioned to alert me if not feeling better in 24 hours- Sooner if worse.  Urine culture pending Also follow-up on recent thyroid med adjustment  This visit occurred during the SARS-CoV-2 public health emergency.  Safety protocols were in place, including screening questions prior to the visit, additional usage of staff PPE, and extensive cleaning of exam room while observing appropriate contact time as indicated for disinfecting solutions.    Signed Lamar Blinks, MD  addnd 10/3- received her urine culture,pt is on cipro Message to pt  Results for orders placed or performed in visit on 05/25/20  Urine Culture   Specimen: Urine  Result Value Ref Range   MICRO NUMBER: [id number]    SPECIMEN QUALITY: Adequate    Sample Source NOT GIVEN    STATUS: FINAL    ISOLATE 1: Escherichia coli (A)       Susceptibility   Escherichia coli - URINE CULTURE, REFLEX    AMOX/CLAVULANIC 4 Sensitive     AMPICILLIN 4 Sensitive     AMPICILLIN/SULBACTAM 4 Sensitive     CEFAZOLIN* <=4 Not Reportable      * For infections other than uncomplicated UTIcaused by E. coli, K. pneumoniae or P. mirabilis:Cefazolin is resistant if MIC > or = 8 mcg/mL.(Distinguishing susceptible versus intermediatefor isolates with MIC < or = 4 mcg/mL requiresadditional testing.)For uncomplicated UTI caused by E. coli,K. pneumoniae or P. mirabilis: Cefazolin issusceptible if MIC <32 mcg/mL and  predictssusceptible to the oral agents cefaclor, cefdinir,cefpodoxime, cefprozil, cefuroxime, cephalexinand loracarbef.    CEFEPIME <=1 Sensitive     CEFTRIAXONE <=1 Sensitive     CIPROFLOXACIN <=0.25 Sensitive     LEVOFLOXACIN <=0.12 Sensitive     ERTAPENEM <=0.5 Sensitive     GENTAMICIN <=1 Sensitive     IMIPENEM <=0.25 Sensitive     NITROFURANTOIN <=16 Sensitive     PIP/TAZO <=4 Sensitive     TOBRAMYCIN <=1 Sensitive     TRIMETH/SULFA* <=20 Sensitive      * For infections other than uncomplicated UTIcaused by E. coli, K. pneumoniae or P. mirabilis:Cefazolin is resistant if MIC > or = 8 mcg/mL.(Distinguishing susceptible versus intermediatefor isolates with MIC < or = 4 mcg/mL  requiresadditional testing.)For uncomplicated UTI caused by E. coli,K. pneumoniae or P. mirabilis: Cefazolin issusceptible if MIC <32 mcg/mL and predictssusceptible to the oral agents cefaclor, cefdinir,cefpodoxime, cefprozil, cefuroxime, cephalexinand loracarbef.Legend:S = Susceptible  I = IntermediateR = Resistant  NS = Not susceptible* = Not tested  NR = Not reported**NN = See antimicrobic comments  TSH  Result Value Ref Range   TSH 2.71 mIU/L  POCT urinalysis dipstick  Result Value Ref Range   Color, UA yellow yellow   Clarity, UA cloudy (A) clear   Glucose, UA negative negative mg/dL   Bilirubin, UA negative negative   Ketones, POC UA negative negative mg/dL   Spec Grav, UA 1.010 1.010 - 1.025   Blood, UA small (A) negative   pH, UA 6.5 5.0 - 8.0   Protein Ur, POC negative negative mg/dL   Urobilinogen, UA 0.2 0.2 or 1.0 E.U./dL   Nitrite, UA Negative Negative   Leukocytes, UA Moderate (2+) (A) Negative  POCT urine pregnancy  Result Value Ref Range   Preg Test, Ur Negative Negative

## 2020-05-24 NOTE — Patient Instructions (Addendum)
It was good to see you again today, I will be in touch with your urine culture and TSH as soon as possible We are going to use cipro as you are running a mild temp Please let me know if you are not improving within 24 hours- sooner if getting worse!

## 2020-05-25 ENCOUNTER — Encounter: Payer: Self-pay | Admitting: Family Medicine

## 2020-05-25 ENCOUNTER — Other Ambulatory Visit: Payer: Self-pay

## 2020-05-25 ENCOUNTER — Ambulatory Visit (INDEPENDENT_AMBULATORY_CARE_PROVIDER_SITE_OTHER): Payer: BC Managed Care – PPO | Admitting: Family Medicine

## 2020-05-25 VITALS — BP 122/78 | HR 75 | Temp 99.1°F | Resp 16 | Ht 63.5 in | Wt 178.0 lb

## 2020-05-25 DIAGNOSIS — N3000 Acute cystitis without hematuria: Secondary | ICD-10-CM | POA: Diagnosis not present

## 2020-05-25 DIAGNOSIS — R350 Frequency of micturition: Secondary | ICD-10-CM | POA: Diagnosis not present

## 2020-05-25 DIAGNOSIS — E039 Hypothyroidism, unspecified: Secondary | ICD-10-CM

## 2020-05-25 LAB — POCT URINALYSIS DIP (MANUAL ENTRY)
Bilirubin, UA: NEGATIVE
Glucose, UA: NEGATIVE mg/dL
Ketones, POC UA: NEGATIVE mg/dL
Nitrite, UA: NEGATIVE
Protein Ur, POC: NEGATIVE mg/dL
Spec Grav, UA: 1.01 (ref 1.010–1.025)
Urobilinogen, UA: 0.2 E.U./dL
pH, UA: 6.5 (ref 5.0–8.0)

## 2020-05-25 LAB — POCT URINE PREGNANCY: Preg Test, Ur: NEGATIVE

## 2020-05-25 MED ORDER — CIPROFLOXACIN HCL 500 MG PO TABS: 500.0000 mg | ORAL_TABLET | Freq: Two times a day (BID) | ORAL | 0 refills | Status: DC

## 2020-05-26 ENCOUNTER — Encounter: Payer: Self-pay | Admitting: Family Medicine

## 2020-05-26 LAB — TSH: TSH: 2.71 mIU/L

## 2020-05-27 LAB — URINE CULTURE
MICRO NUMBER:: 11015786
SPECIMEN QUALITY:: ADEQUATE

## 2020-05-28 ENCOUNTER — Encounter: Payer: Self-pay | Admitting: Family Medicine

## 2020-05-28 DIAGNOSIS — R350 Frequency of micturition: Secondary | ICD-10-CM

## 2020-06-05 ENCOUNTER — Ambulatory Visit: Payer: BC Managed Care – PPO | Admitting: Podiatry

## 2020-06-06 NOTE — Telephone Encounter (Signed)
Ok to schedule patient to drop off sample or would you like to see her again and re-evaluate her sxs?

## 2020-06-09 ENCOUNTER — Other Ambulatory Visit: Payer: BC Managed Care – PPO

## 2020-06-09 ENCOUNTER — Other Ambulatory Visit: Payer: Self-pay

## 2020-06-09 DIAGNOSIS — R350 Frequency of micturition: Secondary | ICD-10-CM

## 2020-06-10 LAB — URINE CULTURE
MICRO NUMBER:: 11077586
Result:: NO GROWTH
SPECIMEN QUALITY:: ADEQUATE

## 2020-06-11 ENCOUNTER — Encounter: Payer: Self-pay | Admitting: Family Medicine

## 2020-06-12 ENCOUNTER — Other Ambulatory Visit: Payer: BC Managed Care – PPO

## 2020-07-26 ENCOUNTER — Other Ambulatory Visit: Payer: Self-pay | Admitting: Family Medicine

## 2020-07-26 DIAGNOSIS — E039 Hypothyroidism, unspecified: Secondary | ICD-10-CM

## 2020-08-11 ENCOUNTER — Encounter: Payer: Self-pay | Admitting: Family Medicine

## 2020-08-11 MED ORDER — LEVOTHYROXINE SODIUM 50 MCG PO TABS: 50.0000 ug | ORAL_TABLET | Freq: Every day | ORAL | 3 refills | Status: DC

## 2020-08-14 ENCOUNTER — Encounter: Payer: Self-pay | Admitting: Family Medicine

## 2020-08-14 MED ORDER — NORETHINDRONE ACET-ETHINYL EST 1.5-30 MG-MCG PO TABS
1.0000 | ORAL_TABLET | Freq: Every day | ORAL | 3 refills | Status: DC
Start: 2020-08-14 — End: 2021-07-30

## 2020-09-11 ENCOUNTER — Encounter: Payer: Self-pay | Admitting: Family Medicine

## 2020-10-27 ENCOUNTER — Encounter: Payer: Self-pay | Admitting: Family Medicine

## 2020-11-18 DIAGNOSIS — E559 Vitamin D deficiency, unspecified: Secondary | ICD-10-CM | POA: Insufficient documentation

## 2020-11-18 NOTE — Patient Instructions (Addendum)
It was great to see you again today- I will be in touch with your labs asap Assuming all is well please see me in about 6 months   We will adjust your thyroid if needed!  Great job with exercise

## 2020-11-18 NOTE — Progress Notes (Addendum)
[facility] at [facility] [place], [place], [place] [address] [phone]  Date:  11/22/2020   Name:  Selena Chapman   DOB:  [birth year]   MRN:  [id number]  PCP:  Darreld Mclean, MD    Chief Complaint: 6 month follow up and lab work   History of Present Illness:  Selena Chapman is a 50 y.o. very pleasant female patient who presents with the following:  Here today for a follow-up visit Last seen by myself 6 months ago- History of hypothyroidism, hyperlipidemia, asthma, bipolar disorder, vit D def  covid booster Pap UTD mammo now due Labs in August- low vit D at that time   She is wondering if her thyroid may be off as she has felt cold and tired She is tying to exercise a lot but is having a hard time losing weight  For exercise she is enjoying her Peloton and doing some walking  She was recently in [place] for exercise and did a lot of walking,  They had a great time   Pulse Readings from Last 3 Encounters:  11/22/20 (!) 52  05/25/20 75  05/08/20 68   Her psychiatrist is Dr Toy Care.    Lab Results  Component Value Date   TSH 2.71 05/25/2020   She is on 50 of levothyroxine right now  Wt Readings from Last 3 Encounters:  11/22/20 172 lb (78 kg)  05/25/20 178 lb (80.7 kg)  05/08/20 174 lb (78.9 kg)   She sleeps pretty well at night   Patient Active Problem List   Diagnosis Date Noted  . Vitamin D deficiency 11/18/2020  . Physical exam, annual 06/03/2019  . Bipolar disorder ([place]) 06/03/2019  . 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 Acquired hypothyroidism 12/29/2017  . Family history of colon cancer 12/29/2017  . Cough 10/10/2017  . History of asthma 10/10/2017  . History of vitamin D deficiency 08/11/2014  . Hyperlipidemia 08/11/2014  . Obesity 08/11/2014  . Asthma 09/23/2011  . Environmental allergies 09/23/2011  . Depression     Past Medical History:  Diagnosis Date  . Allergy   . Anxiety   .  Asthma   . Bipolar affect, depressed ([place])   . Bipolar disorder ([place]) 06/03/2019  . Depression   . Physical exam, annual 06/03/2019  . Plantar fasciitis     Past Surgical History:  Procedure Laterality Date  . APPENDECTOMY      Social History   Tobacco Use  . Smoking status: Never Smoker  . Smokeless tobacco: Never Used  Substance Use Topics  . Alcohol use: No  . Drug use: No    Family History  Problem Relation Age of Onset  . Hypertension Mother   . Depression Sister     No Known Allergies  Medication list has been reviewed and updated.  Current Outpatient Medications on File Prior to Visit  Medication Sig Dispense Refill  . fexofenadine (ALLEGRA) 180 MG tablet Take 180 mg by mouth daily.    . carbamazepine (TEGRETOL) 200 MG tablet Take 400 mg by mouth daily.    . clonazePAM (KLONOPIN) 1 MG tablet Take 1 tablet (1 mg total) by mouth 3 (three) times daily as needed for anxiety. for anxiety (Patient taking differently: Take 1 mg by mouth as needed for anxiety. for anxiety) 90 tablet 1  . D3-1000 25 MCG (1000 UT) capsule Take 1,000 Units by mouth daily.    Marland Kitchen lamoTRIgine (LAMICTAL)  200 MG tablet Take 200 mg by mouth in the morning and at bedtime.     Marland Kitchen levothyroxine (SYNTHROID) 50 MCG tablet Take 1 tablet (50 mcg total) by mouth daily before breakfast. 30 tablet 3  . Norethindrone Acetate-Ethinyl Estradiol (AUROVELA 1.5/30) 1.5-30 MG-MCG tablet Take 1 tablet by mouth daily. 84 tablet 3  . traZODone (DESYREL) 50 MG tablet Take 50 mg by mouth at bedtime. Patient taking half tablet as needed for sleep     No current facility-administered medications on file prior to visit.    Review of Systems:  As per HPI- otherwise negative.   Physical Examination: Vitals:   11/22/20 0858  BP: 116/76  Pulse: (!) 52  Resp: 16  Temp: (!) 97.3 F (36.3 C)  SpO2: 99%   Vitals:   11/22/20 0858  Weight: 172 lb (78 kg)  Height: 5' 3.5" (1.613 m)   Body mass index is 29.99  kg/m. Ideal Body Weight: Weight in (lb) to have BMI = 25: 143.1  GEN: no acute distress.  Overweight, looks well  HEENT: Atraumatic, Normocephalic.   Bilateral TM wnl, oropharynx normal.  PEERL,EOMI.   Ears and Nose: No external deformity. CV: RRR, No M/G/R. No JVD. No thrill. No extra heart sounds. PULM: CTA B, no wheezes, crackles, rhonchi. No retractions. No resp. distress. No accessory muscle use. ABD: S, NT, ND. No rebound. No HSM. EXTR: No c/c/e PSYCH: Normally interactive. Conversant.    Assessment and Plan: Acquired hypothyroidism - Plan: TSH  Vitamin D deficiency - Plan: VITAMIN D 25 Hydroxy (Vit-D Deficiency, Fractures)  Pure hypercholesterolemia  Medication monitoring encounter - Plan: CBC, Comprehensive metabolic panel  Screening mammogram for breast cancer - Plan: MM 3D SCREEN BREAST BILATERAL  Overweight  Following up today She is trying to lose weight but it is slow going.  Doing a great job with exercise. Offered encouragement, we will check her Thyroid today Ordered mammo   Send labs to DR Toy Care when they come in  This visit occurred during the SARS-CoV-2 public health emergency.  Safety protocols were in place, including screening questions prior to the visit, additional usage of staff PPE, and extensive cleaning of exam room while observing appropriate contact time as indicated for disinfecting solutions.    Signed Lamar Blinks, MD  Received labs as below, message to patient  Results for orders placed or performed in visit on 11/22/20  TSH  Result Value Ref Range   TSH 3.18 0.35 - 4.50 uIU/mL  VITAMIN D 25 Hydroxy (Vit-D Deficiency, Fractures)  Result Value Ref Range   VITD 29.26 (L) 30.00 - 100.00 ng/mL  CBC  Result Value Ref Range   WBC 4.1 4.0 - 10.5 K/uL   RBC 4.37 3.87 - 5.11 Mil/uL   Platelets 205.0 150.0 - 400.0 K/uL   Hemoglobin 13.7 12.0 - 15.0 g/dL   HCT 40.6 36.0 - 46.0 %   MCV 93.0 78.0 - 100.0 fl   MCHC 33.7 30.0 - 36.0 g/dL    RDW 13.7 11.5 - 15.5 %  Comprehensive metabolic panel  Result Value Ref Range   Sodium 139 135 - 145 mEq/L   Potassium 4.5 3.5 - 5.1 mEq/L   Chloride 103 96 - 112 mEq/L   CO2 28 19 - 32 mEq/L   Glucose, Bld 83 70 - 99 mg/dL   BUN 13 6 - 23 mg/dL   Creatinine, Ser 0.92 0.40 - 1.20 mg/dL   Total Bilirubin 0.5 0.2 - 1.2 mg/dL   Alkaline Phosphatase  45 39 - 117 U/L   AST 14 0 - 37 U/L   ALT 10 0 - 35 U/L   Total Protein 6.8 6.0 - 8.3 g/dL   Albumin 4.3 3.5 - 5.2 g/dL   GFR 73.89 >60.00 mL/min   Calcium 9.7 8.4 - 10.5 mg/dL

## 2020-11-22 ENCOUNTER — Other Ambulatory Visit: Payer: Self-pay

## 2020-11-22 ENCOUNTER — Ambulatory Visit (INDEPENDENT_AMBULATORY_CARE_PROVIDER_SITE_OTHER): Payer: 59 | Admitting: Family Medicine

## 2020-11-22 ENCOUNTER — Encounter: Payer: Self-pay | Admitting: Family Medicine

## 2020-11-22 VITALS — BP 116/76 | HR 52 | Temp 97.3°F | Resp 16 | Ht 63.5 in | Wt 172.0 lb

## 2020-11-22 DIAGNOSIS — E663 Overweight: Secondary | ICD-10-CM

## 2020-11-22 DIAGNOSIS — E039 Hypothyroidism, unspecified: Secondary | ICD-10-CM | POA: Diagnosis not present

## 2020-11-22 DIAGNOSIS — Z5181 Encounter for therapeutic drug level monitoring: Secondary | ICD-10-CM

## 2020-11-22 DIAGNOSIS — E559 Vitamin D deficiency, unspecified: Secondary | ICD-10-CM

## 2020-11-22 DIAGNOSIS — E7800 Pure hypercholesterolemia, unspecified: Secondary | ICD-10-CM

## 2020-11-22 DIAGNOSIS — Z1231 Encounter for screening mammogram for malignant neoplasm of breast: Secondary | ICD-10-CM

## 2020-11-22 LAB — CBC
HCT: 40.6 % (ref 36.0–46.0)
Hemoglobin: 13.7 g/dL (ref 12.0–15.0)
MCHC: 33.7 g/dL (ref 30.0–36.0)
MCV: 93 fl (ref 78.0–100.0)
Platelets: 205 10*3/uL (ref 150.0–400.0)
RBC: 4.37 Mil/uL (ref 3.87–5.11)
RDW: 13.7 % (ref 11.5–15.5)
WBC: 4.1 10*3/uL (ref 4.0–10.5)

## 2020-11-22 LAB — COMPREHENSIVE METABOLIC PANEL
ALT: 10 U/L (ref 0–35)
AST: 14 U/L (ref 0–37)
Albumin: 4.3 g/dL (ref 3.5–5.2)
Alkaline Phosphatase: 45 U/L (ref 39–117)
BUN: 13 mg/dL (ref 6–23)
CO2: 28 mEq/L (ref 19–32)
Calcium: 9.7 mg/dL (ref 8.4–10.5)
Chloride: 103 mEq/L (ref 96–112)
Creatinine, Ser: 0.92 mg/dL (ref 0.40–1.20)
GFR: 73.89 mL/min (ref >60.00)
Glucose, Bld: 83 mg/dL (ref 70–99)
Potassium: 4.5 mEq/L (ref 3.5–5.1)
Sodium: 139 mEq/L (ref 135–145)
Total Bilirubin: 0.5 mg/dL (ref 0.2–1.2)
Total Protein: 6.8 g/dL (ref 6.0–8.3)

## 2020-11-22 LAB — VITAMIN D 25 HYDROXY (VIT D DEFICIENCY, FRACTURES): VITD: 29.26 ng/mL — ABNORMAL LOW (ref 30.00–100.00)

## 2020-11-22 LAB — TSH: TSH: 3.18 u[IU]/mL (ref 0.35–4.50)

## 2021-01-13 ENCOUNTER — Other Ambulatory Visit: Payer: Self-pay | Admitting: Family Medicine

## 2021-01-24 ENCOUNTER — Encounter: Payer: Self-pay | Admitting: Family Medicine

## 2021-02-13 ENCOUNTER — Other Ambulatory Visit: Payer: Self-pay | Admitting: Family Medicine

## 2021-02-14 ENCOUNTER — Ambulatory Visit (INDEPENDENT_AMBULATORY_CARE_PROVIDER_SITE_OTHER): Payer: 59 | Admitting: Family Medicine

## 2021-02-14 ENCOUNTER — Encounter: Payer: Self-pay | Admitting: Family Medicine

## 2021-02-14 ENCOUNTER — Other Ambulatory Visit: Payer: Self-pay

## 2021-02-14 VITALS — BP 122/72 | HR 65 | Resp 16 | Ht 63.5 in | Wt 174.0 lb

## 2021-02-14 DIAGNOSIS — R519 Headache, unspecified: Secondary | ICD-10-CM | POA: Diagnosis not present

## 2021-02-14 DIAGNOSIS — U071 COVID-19: Secondary | ICD-10-CM

## 2021-02-14 MED ORDER — AMOXICILLIN 500 MG PO CAPS: 1000.0000 mg | ORAL_CAPSULE | Freq: Two times a day (BID) | ORAL | 0 refills | Status: DC

## 2021-02-14 MED ORDER — PREDNISONE 20 MG PO TABS: ORAL_TABLET | ORAL | 0 refills | Status: DC

## 2021-02-14 NOTE — Patient Instructions (Signed)
It was good to see you again today- take care and let me know if you are not seeing improvement of your headaches and sinus symptoms within a couple of days- Sooner if worse.    If you have any concerns of mania please contact me or your mental health care provider asap

## 2021-02-14 NOTE — Progress Notes (Signed)
[facility] at [facility] [place], [place], [place] [address] [phone]  Date:  02/14/2021   Name:  Selena Chapman   DOB:  [birth year]   MRN:  [id number]  PCP:  Selena Mclean, MD    Chief Complaint: Sinusitis (Headache, pressure in frontal lobe, puffiness in face, slight cough, no fever/)   History of Present Illness:  Selena Chapman is a 50 y.o. very pleasant female patient who presents with the following:  Pt seen today for persistent headaches after covid 19  She tested positive for COVID-19 on June 1-she thinks she caught COVID on an airplane She is vaxed and boosted  We did not do antivirals for her during acute infection She had HA though her covid 19-headaches are relatively common symptom for her  Her other sx are now better, but headaches have persisted -she is now also having frontal sinus pressure and feels like her face is swollen She has some PND which can cause nausea but no vomiting The HA have been constant except she did not have it for one day-she felt it was resolved, but then it came back She is using ibuprofen and tylenol which are not helping her  She does tend to get a sinus infection after a URI No fever- she had tmax of 99.1 during her covid infection No cough at this time No vomiting or diarrhea  The HA is central in her frontal head and forehead, and into her face She does have some tooth pain  No neuro sx   Her daughter is 66 yo   She has used pred in the past and did ok -she does have a mood disorder and is aware of the need to use caution with steroids  She enjoys exercise on her Peloton bike, but has not been able to work out like she normally does during illness   Patient Active Problem List   Diagnosis Date Noted   Vitamin D deficiency 11/18/2020   Physical exam, annual 06/03/2019   Bipolar disorder ([place]) 06/03/2019   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   Acquired  hypothyroidism 12/29/2017   Family history of colon cancer 12/29/2017   Cough 10/10/2017   History of asthma 10/10/2017   History of vitamin D deficiency 08/11/2014   Hyperlipidemia 08/11/2014   Obesity 08/11/2014   Asthma 09/23/2011   Environmental allergies 09/23/2011   Depression     Past Medical History:  Diagnosis Date   Allergy    Anxiety    Asthma    Bipolar affect, depressed ([place])    Bipolar disorder ([place]) 06/03/2019   Depression    Physical exam, annual 06/03/2019   Plantar fasciitis     Past Surgical History:  Procedure Laterality Date   APPENDECTOMY      Social History   Tobacco Use   Smoking status: Never   Smokeless tobacco: Never  Substance Use Topics   Alcohol use: No   Drug use: No    Family History  Problem Relation Age of Onset   Hypertension Mother    Depression Sister     No Known Allergies  Medication list has been reviewed and updated.  Current Outpatient Medications on File Prior to Visit  Medication Sig Dispense Refill   carbamazepine (TEGRETOL) 200 MG tablet Take 400 mg by mouth daily.     D3-1000 25 MCG (1000 UT) capsule Take 1,000 Units by mouth daily.     fexofenadine (ALLEGRA)  180 MG tablet Take 180 mg by mouth daily.     lamoTRIgine (LAMICTAL) 200 MG tablet Take 200 mg by mouth in the morning and at bedtime.      levothyroxine (SYNTHROID) 50 MCG tablet TAKE 1 TABLET(50 MCG) BY MOUTH DAILY BEFORE AND BREAKFAST 30 tablet 3   Norethindrone Acetate-Ethinyl Estradiol (AUROVELA 1.5/30) 1.5-30 MG-MCG tablet Take 1 tablet by mouth daily. 84 tablet 3   traZODone (DESYREL) 50 MG tablet Take 50 mg by mouth at bedtime. Patient taking half tablet as needed for sleep     clonazePAM (KLONOPIN) 1 MG tablet Take 1 tablet (1 mg total) by mouth 3 (three) times daily as needed for anxiety. for anxiety (Patient taking differently: Take 1 mg by mouth as needed for anxiety. for anxiety) 90 tablet 1   No current facility-administered medications on file  prior to visit.    Review of Systems:  As per HPI- otherwise negative.   Physical Examination: Vitals:   02/14/21 0956  BP: 122/72  Pulse: 65  Resp: 16  SpO2: 98%   Vitals:   02/14/21 0956  Weight: 174 lb (78.9 kg)  Height: 5' 3.5" (1.613 m)   Body mass index is 30.34 kg/m. Ideal Body Weight: Weight in (lb) to have BMI = 25: 143.1  GEN: no acute distress.  Overweight, otherwise looks well HEENT: Atraumatic, Normocephalic. Bilateral TM wnl, oropharynx normal.  PEERL,EOMI. her nasal cavity is inflamed, she has tenderness with percussion of her sinuses Ears and Nose: No external deformity. CV: RRR, No M/G/R. No JVD. No thrill. No extra heart sounds. PULM: CTA B, no wheezes, crackles, rhonchi. No retractions. No resp. distress. No accessory muscle use. ABD: S, NT, ND, +BS. No rebound. No HSM. EXTR: No c/c/e PSYCH: Normally interactive. Conversant.  Normal strength, sensation, deep tendon reflex of all extremities.  Normal Romberg.  Normal facial movement  Assessment and Plan: Sinus headache - Plan: amoxicillin (AMOXIL) 500 MG capsule, predniSONE (DELTASONE) 20 MG tablet  COVID-19  Frequent headaches  Patient seen today with frequent headaches which seem to have started after recent COVID-19 infection.  Time it appears she may actually have a sinus infection.  We will treat her with amoxicillin and a conservative dose of prednisone.  She will watch for any changes in her mood or sleeplessness, if these occur she will stop prednisone and contact myself or her mental health care provider Otherwise, she will let me know if not seeing improvement within 2 to 3 days-sooner if worse  This visit occurred during the SARS-CoV-2 public health emergency.  Safety protocols were in place, including screening questions prior to the visit, additional usage of staff PPE, and extensive cleaning of exam room while observing appropriate contact time as indicated for disinfecting solutions.     Signed Lamar Blinks, MD

## 2021-02-28 ENCOUNTER — Encounter: Payer: Self-pay | Admitting: Family Medicine

## 2021-07-29 ENCOUNTER — Other Ambulatory Visit: Payer: Self-pay | Admitting: Family Medicine

## 2021-07-30 ENCOUNTER — Encounter: Payer: Self-pay | Admitting: Family Medicine

## 2021-07-30 DIAGNOSIS — E039 Hypothyroidism, unspecified: Secondary | ICD-10-CM

## 2021-07-30 DIAGNOSIS — Z5181 Encounter for therapeutic drug level monitoring: Secondary | ICD-10-CM

## 2021-08-02 ENCOUNTER — Other Ambulatory Visit (INDEPENDENT_AMBULATORY_CARE_PROVIDER_SITE_OTHER): Payer: 59

## 2021-08-02 ENCOUNTER — Encounter: Payer: Self-pay | Admitting: Family Medicine

## 2021-08-02 DIAGNOSIS — E039 Hypothyroidism, unspecified: Secondary | ICD-10-CM | POA: Diagnosis not present

## 2021-08-02 DIAGNOSIS — Z5181 Encounter for therapeutic drug level monitoring: Secondary | ICD-10-CM | POA: Diagnosis not present

## 2021-08-02 LAB — COMPREHENSIVE METABOLIC PANEL
ALT: 12 U/L (ref 0–35)
AST: 14 U/L (ref 0–37)
Albumin: 4.1 g/dL (ref 3.5–5.2)
Alkaline Phosphatase: 36 U/L — ABNORMAL LOW (ref 39–117)
BUN: 12 mg/dL (ref 6–23)
CO2: 27 mEq/L (ref 19–32)
Calcium: 9.2 mg/dL (ref 8.4–10.5)
Chloride: 104 mEq/L (ref 96–112)
Creatinine, Ser: 0.86 mg/dL (ref 0.40–1.20)
GFR: 79.73 mL/min (ref >60.00)
Glucose, Bld: 78 mg/dL (ref 70–99)
Potassium: 4.6 mEq/L (ref 3.5–5.1)
Sodium: 137 mEq/L (ref 135–145)
Total Bilirubin: 0.4 mg/dL (ref 0.2–1.2)
Total Protein: 6.5 g/dL (ref 6.0–8.3)

## 2021-08-02 LAB — CBC
HCT: 41.1 % (ref 36.0–46.0)
Hemoglobin: 13.6 g/dL (ref 12.0–15.0)
MCHC: 33.2 g/dL (ref 30.0–36.0)
MCV: 94.7 fl (ref 78.0–100.0)
Platelets: 215 10*3/uL (ref 150.0–400.0)
RBC: 4.34 Mil/uL (ref 3.87–5.11)
RDW: 13.5 % (ref 11.5–15.5)
WBC: 4.1 10*3/uL (ref 4.0–10.5)

## 2021-08-02 LAB — TSH: TSH: 3.26 u[IU]/mL (ref 0.35–5.50)

## 2021-09-05 ENCOUNTER — Encounter: Payer: Self-pay | Admitting: Family Medicine

## 2021-09-05 MED ORDER — ALBUTEROL SULFATE HFA 108 (90 BASE) MCG/ACT IN AERS: 2.0000 | INHALATION_SPRAY | Freq: Four times a day (QID) | RESPIRATORY_TRACT | 2 refills | Status: DC | PRN

## 2021-10-12 DIAGNOSIS — F3176 Bipolar disorder, in full remission, most recent episode depressed: Secondary | ICD-10-CM | POA: Diagnosis not present

## 2021-10-12 DIAGNOSIS — F411 Generalized anxiety disorder: Secondary | ICD-10-CM | POA: Diagnosis not present

## 2021-10-12 DIAGNOSIS — F3174 Bipolar disorder, in full remission, most recent episode manic: Secondary | ICD-10-CM | POA: Diagnosis not present

## 2021-11-14 DIAGNOSIS — F4323 Adjustment disorder with mixed anxiety and depressed mood: Secondary | ICD-10-CM | POA: Diagnosis not present

## 2021-11-22 DIAGNOSIS — F4323 Adjustment disorder with mixed anxiety and depressed mood: Secondary | ICD-10-CM | POA: Diagnosis not present

## 2021-12-06 DIAGNOSIS — F4323 Adjustment disorder with mixed anxiety and depressed mood: Secondary | ICD-10-CM | POA: Diagnosis not present

## 2021-12-13 DIAGNOSIS — F4323 Adjustment disorder with mixed anxiety and depressed mood: Secondary | ICD-10-CM | POA: Diagnosis not present

## 2021-12-27 DIAGNOSIS — F4323 Adjustment disorder with mixed anxiety and depressed mood: Secondary | ICD-10-CM | POA: Diagnosis not present

## 2022-01-06 NOTE — Progress Notes (Addendum)
Therapist, music at [facility] ?[address], [address] ?[place], [place] [address] ?336 [phone] ?Fax [phone] ? ?Date:  01/09/2022  ? ?Name:  Selena Chapman   DOB:  1970-09-29   MRN:  [id number] ? ?PCP:  Darreld Mclean, MD  ? ? ?Chief Complaint: Follow-up (Pt has some issues with weight gain, fatigue. She did stop her Synthroid so she thinks this or Perimenopause is the cause. ) ? ? ?History of Present Illness: ? ?Selena Chapman is a 51 y.o. very pleasant female patient who presents with the following: ? ?Patient seen today for follow-up ?Most recent visit with myself June of last year-history of hyperlipidemia, asthma, depression/bipolar disorder, hypothyroidism ?Her psychiatrist is Dr Toy Care  ?Today pt notes she had a rough year- she lost her job after her boss retired ?She did get a new job- however yesterday she learned her new job is leaving too!  She has been under stress with her ex-husband as well ?She stopped her levothyroxine last year- she ran out and stopped taking it ? ?She continues to exercise and to try and take care of her health - she has gained weight and thinks this may be due to menopause and/ or not aking her thyroid  ? ?Her daughter is starting HS at [employer] early college this fall!  This is a big accomplishment  ? ?Wt Readings from Last 3 Encounters:  ?01/09/22 191 lb 12.8 oz (87 kg)  ?02/14/21 174 lb (78.9 kg)  ?11/22/20 172 lb (78 kg)  ? ?Albuterol ?Tegretol ?Clonazepam- uses prn ?Lamictal ?Birth control pills ?Levothyroxine- not taking now  ? ?COVID-19 booster- pt reports this is UTD  ?Tetanus is due ?Cologuard up-to-date ?Pap is up-to-date ?Mammogram may be due ?Can update labs today ?Patient Active Problem List  ? Diagnosis Date Noted  ? Vitamin D deficiency 11/18/2020  ? Physical exam, annual 06/03/2019  ? Bipolar disorder ([place]) 06/03/2019  ? 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 Acquired hypothyroidism 12/29/2017  ? Family history of colon cancer 12/29/2017  ?  Cough 10/10/2017  ? History of asthma 10/10/2017  ? History of vitamin D deficiency 08/11/2014  ? Hyperlipidemia 08/11/2014  ? Obesity 08/11/2014  ? Asthma 09/23/2011  ? Environmental allergies 09/23/2011  ? Depression   ? ? ?Past Medical History:  ?Diagnosis Date  ? Allergy   ? Anxiety   ? Asthma   ? Bipolar affect, depressed ([place])   ? Bipolar disorder ([place]) 06/03/2019  ? Depression   ? Physical exam, annual 06/03/2019  ? Plantar fasciitis   ? ? ?Past Surgical History:  ?Procedure Laterality Date  ? APPENDECTOMY    ? ? ?Social History  ? ?Tobacco Use  ? Smoking status: Never  ? Smokeless tobacco: Never  ?Substance Use Topics  ? Alcohol use: No  ? Drug use: No  ? ? ?Family History  ?Problem Relation Age of Onset  ? Hypertension Mother   ? Depression Sister   ? ? ?No Known Allergies ? ?Medication list has been reviewed and updated. ? ?Current Outpatient Medications on File Prior to Visit  ?Medication Sig Dispense Refill  ? albuterol (VENTOLIN HFA) 108 (90 Base) MCG/ACT inhaler Inhale 2 puffs into the lungs every 6 (six) hours as needed for wheezing or shortness of breath. 1 each 2  ? carbamazepine (TEGRETOL) 200 MG tablet Take 400 mg by mouth daily.    ? D3-1000 25 MCG (1000 UT) capsule Take 1,000 Units by mouth daily.    ?  fexofenadine (ALLEGRA) 180 MG tablet Take 180 mg by mouth daily.    ? JUNEL 1.5/30 1.5-30 MG-MCG tablet TAKE 1 TABLET BY MOUTH DAILY 84 tablet 3  ? lamoTRIgine (LAMICTAL) 200 MG tablet Take 200 mg by mouth in the morning and at bedtime.     ? clonazePAM (KLONOPIN) 1 MG tablet Take 1 tablet (1 mg total) by mouth 3 (three) times daily as needed for anxiety. for anxiety (Patient taking differently: Take 1 mg by mouth as needed for anxiety. for anxiety) 90 tablet 1  ? ?No current facility-administered medications on file prior to visit.  ? ? ?Review of Systems: ? ?As per HPI- otherwise negative. ? ? ?Physical Examination: ?Vitals:  ? 01/09/22 0850  ?BP: 122/80  ?Pulse: 63  ?Resp: 18  ?Temp: 98.2 ?F  (36.8 ?C)  ?SpO2: 99%  ? ?Vitals:  ? 01/09/22 0850  ?Weight: 191 lb 12.8 oz (87 kg)  ?Height: 5' 3.5" (1.613 m)  ? ?Body mass index is 33.44 kg/m?. ?Ideal Body Weight: Weight in (lb) to have BMI = 25: 143.1 ? ?GEN: no acute distress.  Obese, looks well  ?HEENT: Atraumatic, Normocephalic.  Bilateral TM wnl, oropharynx normal.  PEERL,EOMI.   ?Ears and Nose: No external deformity. ?CV: RRR, No M/G/R. No JVD. No thrill. No extra heart sounds. ?PULM: CTA B, no wheezes, crackles, rhonchi. No retractions. No resp. distress. No accessory muscle use. ?ABD: S, NT, ND, +BS. No rebound. No HSM. ?EXTR: No c/c/e ?PSYCH: Normally interactive. Conversant.  ? ? ?Assessment and Plan: ?Pure hypercholesterolemia - Plan: Lipid panel ? ?Acquired hypothyroidism - Plan: TSH, levothyroxine (SYNTHROID) 50 MCG tablet ? ?Screening for deficiency anemia - Plan: CBC ? ?Screening for diabetes mellitus - Plan: Comprehensive metabolic panel, Hemoglobin A1c ? ?Encounter for screening mammogram for malignant neoplasm of breast - Plan: MM 3D SCREEN BREAST BILATERAL ? ?Irregular menses - Plan: FSH ? ?Immunization due - Plan: Td vaccine greater than or equal to 7yo preservative free IM ? ?Will plan further follow- up pending labs. ? ?Good to see you again today, I will be in touch with your lab work ?Tetanus today ?Pap next year!  ?Start back on the thyroid med and we will see where your TSH is.  Ok to take a different time of day if you need to- we can adjust the dose to accommodate food in your stomach if needed  ? ?Assuming all is well please see me in 6 months and take care!  ?Signed ?Lamar Blinks, MD ? ?Received her labs as below, message to patient ?Results for orders placed or performed in visit on 01/09/22  ?CBC  ?Result Value Ref Range  ? WBC 5.1 4.0 - 10.5 K/uL  ? RBC 4.20 3.87 - 5.11 Mil/uL  ? Platelets 231.0 150.0 - 400.0 K/uL  ? Hemoglobin 13.3 12.0 - 15.0 g/dL  ? HCT 39.6 36.0 - 46.0 %  ? MCV 94.4 78.0 - 100.0 fl  ? MCHC 33.6 30.0 -  36.0 g/dL  ? RDW 13.2 11.5 - 15.5 %  ?Comprehensive metabolic panel  ?Result Value Ref Range  ? Sodium 137 135 - 145 mEq/L  ? Potassium 4.1 3.5 - 5.1 mEq/L  ? Chloride 102 96 - 112 mEq/L  ? CO2 26 19 - 32 mEq/L  ? Glucose, Bld 83 70 - 99 mg/dL  ? BUN 14 6 - 23 mg/dL  ? Creatinine, Ser 0.89 0.40 - 1.20 mg/dL  ? Total Bilirubin 0.4 0.2 - 1.2 mg/dL  ? Alkaline Phosphatase  41 39 - 117 U/L  ? AST 12 0 - 37 U/L  ? ALT 11 0 - 35 U/L  ? Total Protein 6.4 6.0 - 8.3 g/dL  ? Albumin 4.1 3.5 - 5.2 g/dL  ? GFR 76.29 >60.00 mL/min  ? Calcium 9.1 8.4 - 10.5 mg/dL  ?Hemoglobin A1c  ?Result Value Ref Range  ? Hgb A1c MFr Bld 5.0 4.6 - 6.5 %  ?Lipid panel  ?Result Value Ref Range  ? Cholesterol 322 (H) 0 - 200 mg/dL  ? Triglycerides 98.0 0.0 - 149.0 mg/dL  ? HDL 96.90 >39.00 mg/dL  ? VLDL 19.6 0.0 - 40.0 mg/dL  ? LDL Cholesterol 206 (H) 0 - 99 mg/dL  ? Total CHOL/HDL Ratio 3   ? NonHDL 225.26   ?TSH  ?Result Value Ref Range  ? TSH 4.40 0.35 - 5.50 uIU/mL  ?[place]  ?Result Value Ref Range  ? FSH 19.5 mIU/ML  ? ? ?

## 2022-01-08 NOTE — Patient Instructions (Addendum)
Good to see you again today, I will be in touch with your lab work ?Tetanus today ?Pap next year!  ?Start back on the thyroid med and we will see where your TSH is.  Ok to take a different time of day if you need to- we can adjust the dose to accommodate food in your stomach if needed  ? ?Assuming all is well please see me in 6 months and take care!  ?

## 2022-01-09 ENCOUNTER — Encounter: Payer: Self-pay | Admitting: Family Medicine

## 2022-01-09 ENCOUNTER — Ambulatory Visit (INDEPENDENT_AMBULATORY_CARE_PROVIDER_SITE_OTHER): Payer: BC Managed Care – PPO | Admitting: Family Medicine

## 2022-01-09 VITALS — BP 122/80 | HR 63 | Temp 98.2°F | Resp 18 | Ht 63.5 in | Wt 191.8 lb

## 2022-01-09 DIAGNOSIS — Z130 Encounter for screening for diseases of the blood and blood-forming organs and certain disorders involving the immune mechanism: Secondary | ICD-10-CM | POA: Diagnosis not present

## 2022-01-09 DIAGNOSIS — N926 Irregular menstruation, unspecified: Secondary | ICD-10-CM

## 2022-01-09 DIAGNOSIS — E7800 Pure hypercholesterolemia, unspecified: Secondary | ICD-10-CM | POA: Diagnosis not present

## 2022-01-09 DIAGNOSIS — E039 Hypothyroidism, unspecified: Secondary | ICD-10-CM

## 2022-01-09 DIAGNOSIS — Z23 Encounter for immunization: Secondary | ICD-10-CM | POA: Diagnosis not present

## 2022-01-09 DIAGNOSIS — Z1231 Encounter for screening mammogram for malignant neoplasm of breast: Secondary | ICD-10-CM

## 2022-01-09 DIAGNOSIS — Z131 Encounter for screening for diabetes mellitus: Secondary | ICD-10-CM

## 2022-01-09 DIAGNOSIS — E669 Obesity, unspecified: Secondary | ICD-10-CM

## 2022-01-09 LAB — CBC
HCT: 39.6 % (ref 36.0–46.0)
Hemoglobin: 13.3 g/dL (ref 12.0–15.0)
MCHC: 33.6 g/dL (ref 30.0–36.0)
MCV: 94.4 fl (ref 78.0–100.0)
Platelets: 231 10*3/uL (ref 150.0–400.0)
RBC: 4.2 Mil/uL (ref 3.87–5.11)
RDW: 13.2 % (ref 11.5–15.5)
WBC: 5.1 10*3/uL (ref 4.0–10.5)

## 2022-01-09 LAB — TSH: TSH: 4.4 u[IU]/mL (ref 0.35–5.50)

## 2022-01-09 LAB — COMPREHENSIVE METABOLIC PANEL
ALT: 11 U/L (ref 0–35)
AST: 12 U/L (ref 0–37)
Albumin: 4.1 g/dL (ref 3.5–5.2)
Alkaline Phosphatase: 41 U/L (ref 39–117)
BUN: 14 mg/dL (ref 6–23)
CO2: 26 mEq/L (ref 19–32)
Calcium: 9.1 mg/dL (ref 8.4–10.5)
Chloride: 102 mEq/L (ref 96–112)
Creatinine, Ser: 0.89 mg/dL (ref 0.40–1.20)
GFR: 76.29 mL/min (ref >60.00)
Glucose, Bld: 83 mg/dL (ref 70–99)
Potassium: 4.1 mEq/L (ref 3.5–5.1)
Sodium: 137 mEq/L (ref 135–145)
Total Bilirubin: 0.4 mg/dL (ref 0.2–1.2)
Total Protein: 6.4 g/dL (ref 6.0–8.3)

## 2022-01-09 LAB — HEMOGLOBIN A1C: Hgb A1c MFr Bld: 5 % (ref 4.6–6.5)

## 2022-01-09 LAB — FOLLICLE STIMULATING HORMONE: FSH: 19.5 m[IU]/mL

## 2022-01-09 LAB — LIPID PANEL
Cholesterol: 322 mg/dL — ABNORMAL HIGH (ref 0–200)
HDL: 96.9 mg/dL (ref >39.00)
LDL Cholesterol: 206 mg/dL — ABNORMAL HIGH (ref 0–99)
NonHDL: 225.26
Total CHOL/HDL Ratio: 3
Triglycerides: 98 mg/dL (ref 0.0–149.0)
VLDL: 19.6 mg/dL (ref 0.0–40.0)

## 2022-01-09 MED ORDER — LEVOTHYROXINE SODIUM 50 MCG PO TABS: 50.0000 ug | ORAL_TABLET | Freq: Every day | ORAL | 3 refills | Status: DC

## 2022-01-10 ENCOUNTER — Other Ambulatory Visit: Payer: Self-pay | Admitting: Family Medicine

## 2022-01-10 DIAGNOSIS — E039 Hypothyroidism, unspecified: Secondary | ICD-10-CM

## 2022-01-11 ENCOUNTER — Telehealth: Payer: Self-pay

## 2022-01-11 MED ORDER — WEGOVY 0.25 MG/0.5ML ~~LOC~~ SOAJ: 0.2500 mg | SUBCUTANEOUS | 3 refills | Status: DC

## 2022-01-11 NOTE — Telephone Encounter (Signed)
PA initiated Selena Chapman (Key: [id number]) Rx #: [id number] CLEXNT 0.25MG/0.5ML auto-injectors   Form [employer] [place] Form [phone] NCPDP)  Outcome:  Your prior authorization for Selena Chapman has been approved! MORE INFO Personalized support and financial assistance may be available through the [employer] program. For more information, and to see program requirements, click on the More Info button to the right.  Message from plan: PA Case: [id number], Status: Approved, Coverage Starts on: 01/11/2022 12:00:00 AM, Coverage Ends on: 01/11/2023 12:00:00 AM.

## 2022-01-14 DIAGNOSIS — F4323 Adjustment disorder with mixed anxiety and depressed mood: Secondary | ICD-10-CM | POA: Diagnosis not present

## 2022-01-15 NOTE — Progress Notes (Unsigned)
[place] at [facility] [address] [place], [place] [address] [phone]  Date:  01/16/2022   Name:  Selena Chapman   DOB:  10/26/1970   MRN:  [id number]  PCP:  Darreld Mclean, MD    Chief Complaint: Sore Throat (X Monday and has noticed a placed on her throat. Would like a strep test since she is flying out in a few days. )   History of Present Illness:  Selena Chapman is a 51 y.o. very pleasant female patient who presents with the following:  Pt seen today with a tonsil concern Seen by myself just recently for a routine visit- we have tried to start [facility] for her  She notes a ST 2 days ago, and saw a possible abnl on her right tonsil  She has felt a bit tired- no fever noted at home No cough Some PND Her daughter is not sick but pt did attend a school dance a few days ago and was exposed to a lot of kids No vomiting  Patient Active Problem List   Diagnosis Date Noted   Vitamin D deficiency 11/18/2020   Physical exam, annual 06/03/2019   Bipolar disorder ([place]) 06/03/2019   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   Acquired hypothyroidism 12/29/2017   Family history of colon cancer 12/29/2017   Cough 10/10/2017   History of asthma 10/10/2017   History of vitamin D deficiency 08/11/2014   Hyperlipidemia 08/11/2014   Obesity 08/11/2014   Asthma 09/23/2011   Environmental allergies 09/23/2011   Depression     Past Medical History:  Diagnosis Date   Allergy    Anxiety    Asthma    Bipolar affect, depressed ([place])    Bipolar disorder ([place]) 06/03/2019   Depression    Physical exam, annual 06/03/2019   Plantar fasciitis     Past Surgical History:  Procedure Laterality Date   APPENDECTOMY      Social History   Tobacco Use   Smoking status: Never   Smokeless tobacco: Never  Substance Use Topics   Alcohol use: No   Drug use: No    Family History  Problem Relation Age of Onset   Hypertension Mother     Depression Sister     No Known Allergies  Medication list has been reviewed and updated.  Current Outpatient Medications on File Prior to Visit  Medication Sig Dispense Refill   albuterol (VENTOLIN HFA) 108 (90 Base) MCG/ACT inhaler Inhale 2 puffs into the lungs every 6 (six) hours as needed for wheezing or shortness of breath. 1 each 2   carbamazepine (TEGRETOL) 200 MG tablet Take 400 mg by mouth daily.     D3-1000 25 MCG (1000 UT) capsule Take 1,000 Units by mouth daily.     fexofenadine (ALLEGRA) 180 MG tablet Take 180 mg by mouth daily.     JUNEL 1.5/30 1.5-30 MG-MCG tablet TAKE 1 TABLET BY MOUTH DAILY 84 tablet 3   lamoTRIgine (LAMICTAL) 200 MG tablet Take 200 mg by mouth in the morning and at bedtime.      levothyroxine (SYNTHROID) 50 MCG tablet Take 1 tablet (50 mcg total) by mouth daily before breakfast. 30 tablet 3   Semaglutide-Weight Management (WEGOVY) 0.25 MG/0.5ML SOAJ Inject 0.25 mg into the skin once a week. Increase to 0.5 mg weekly after 4 weeks 2 mL 3   clonazePAM (KLONOPIN) 1 MG tablet Take 1 tablet (1 mg total) by mouth 3 (  three) times daily as needed for anxiety. for anxiety (Patient taking differently: Take 1 mg by mouth as needed for anxiety. for anxiety) 90 tablet 1   No current facility-administered medications on file prior to visit.    Review of Systems:  As per HPI- otherwise negative.   Physical Examination: Vitals:   01/16/22 1548  BP: 120/72  Pulse: 69  Resp: 18  Temp: 99 F (37.2 C)  SpO2: 98%   Vitals:   01/16/22 1548  Weight: 194 lb 3.2 oz (88.1 kg)  Height: 5' 3.5" (1.613 m)   Body mass index is 33.86 kg/m. Ideal Body Weight: Weight in (lb) to have BMI = 25: 143.1  GEN: no acute distress. Obese, looks well  HEENT: Atraumatic, Normocephalic.  Mild erythema of right tonsil only, no LAD TM wnl bilaterally  Ears and Nose: No external deformity. CV: RRR, No M/G/R. No JVD. No thrill. No extra heart sounds. PULM: CTA B, no wheezes,  crackles, rhonchi. No retractions. No resp. distress. No accessory muscle use. EXTR: No c/c/e PSYCH: Normally interactive. Conversant.   Results for orders placed or performed in visit on 01/16/22  POCT rapid strep A  Result Value Ref Range   Rapid Strep A Screen Negative Negative     Assessment and Plan: Pharyngitis, unspecified etiology - Plan: POCT rapid strep A, Culture, Group A Strep, penicillin v potassium (VEETID) 500 MG tablet Pt seen today with pharyngitis- rapid strep negative  Culture pending Pt given rx for penicillin as she is traveling this weekend, can take rx with her in case ST should worsen or culture +   Signed Lamar Blinks, MD

## 2022-01-16 ENCOUNTER — Ambulatory Visit (INDEPENDENT_AMBULATORY_CARE_PROVIDER_SITE_OTHER): Payer: BC Managed Care – PPO | Admitting: Family Medicine

## 2022-01-16 VITALS — BP 120/72 | HR 69 | Temp 99.0°F | Resp 18 | Ht 63.5 in | Wt 194.2 lb

## 2022-01-16 DIAGNOSIS — J029 Acute pharyngitis, unspecified: Secondary | ICD-10-CM | POA: Diagnosis not present

## 2022-01-16 LAB — POCT RAPID STREP A (OFFICE): Rapid Strep A Screen: NEGATIVE

## 2022-01-16 MED ORDER — PENICILLIN V POTASSIUM 500 MG PO TABS: 500.0000 mg | ORAL_TABLET | Freq: Two times a day (BID) | ORAL | 0 refills | Status: DC

## 2022-01-19 LAB — CULTURE, GROUP A STREP
MICRO NUMBER:: 13445137
SPECIMEN QUALITY:: ADEQUATE

## 2022-01-20 ENCOUNTER — Encounter: Payer: Self-pay | Admitting: Family Medicine

## 2022-01-24 ENCOUNTER — Ambulatory Visit: Payer: BC Managed Care – PPO

## 2022-01-24 ENCOUNTER — Encounter: Payer: Self-pay | Admitting: Family Medicine

## 2022-01-24 DIAGNOSIS — U071 COVID-19: Secondary | ICD-10-CM

## 2022-01-24 MED ORDER — MOLNUPIRAVIR EUA 200MG CAPSULE: 4.0000 | ORAL_CAPSULE | Freq: Two times a day (BID) | ORAL | 0 refills | Status: AC

## 2022-01-24 NOTE — Addendum Note (Signed)
Addended by: Lamar Blinks C on: 01/24/2022 05:04 PM   Modules accepted: Orders

## 2022-01-31 DIAGNOSIS — F4323 Adjustment disorder with mixed anxiety and depressed mood: Secondary | ICD-10-CM | POA: Diagnosis not present

## 2022-02-05 ENCOUNTER — Ambulatory Visit (HOSPITAL_BASED_OUTPATIENT_CLINIC_OR_DEPARTMENT_OTHER): Payer: BC Managed Care – PPO

## 2022-02-14 DIAGNOSIS — F4323 Adjustment disorder with mixed anxiety and depressed mood: Secondary | ICD-10-CM | POA: Diagnosis not present

## 2022-02-19 ENCOUNTER — Ambulatory Visit
Admission: RE | Admit: 2022-02-19 | Discharge: 2022-02-19 | Disposition: A | Discharge status: Home / Self Care | Payer: BC Managed Care – PPO | Source: Ambulatory Visit | Attending: Family Medicine | Admitting: Family Medicine

## 2022-02-19 DIAGNOSIS — Z1231 Encounter for screening mammogram for malignant neoplasm of breast: Secondary | ICD-10-CM

## 2022-04-25 DIAGNOSIS — F4323 Adjustment disorder with mixed anxiety and depressed mood: Secondary | ICD-10-CM | POA: Diagnosis not present

## 2022-05-08 DIAGNOSIS — F4323 Adjustment disorder with mixed anxiety and depressed mood: Secondary | ICD-10-CM | POA: Diagnosis not present

## 2022-05-11 DIAGNOSIS — F411 Generalized anxiety disorder: Secondary | ICD-10-CM | POA: Diagnosis not present

## 2022-05-11 DIAGNOSIS — F3174 Bipolar disorder, in full remission, most recent episode manic: Secondary | ICD-10-CM | POA: Diagnosis not present

## 2022-05-11 DIAGNOSIS — F3176 Bipolar disorder, in full remission, most recent episode depressed: Secondary | ICD-10-CM | POA: Diagnosis not present

## 2022-05-30 DIAGNOSIS — F4323 Adjustment disorder with mixed anxiety and depressed mood: Secondary | ICD-10-CM | POA: Diagnosis not present

## 2022-06-03 ENCOUNTER — Other Ambulatory Visit: Payer: Self-pay | Admitting: Family Medicine

## 2022-06-03 DIAGNOSIS — E039 Hypothyroidism, unspecified: Secondary | ICD-10-CM

## 2022-06-12 ENCOUNTER — Encounter: Payer: Self-pay | Admitting: Family Medicine

## 2022-06-12 DIAGNOSIS — E669 Obesity, unspecified: Secondary | ICD-10-CM

## 2022-06-12 MED ORDER — WEGOVY 0.25 MG/0.5ML ~~LOC~~ SOAJ: 0.2500 mg | SUBCUTANEOUS | 0 refills | Status: DC

## 2022-06-17 DIAGNOSIS — F4323 Adjustment disorder with mixed anxiety and depressed mood: Secondary | ICD-10-CM | POA: Diagnosis not present

## 2022-06-17 MED ORDER — PHENTERMINE HCL 15 MG PO CAPS: 15.0000 mg | ORAL_CAPSULE | ORAL | 2 refills | Status: DC

## 2022-06-17 NOTE — Addendum Note (Signed)
Addended by: Lamar Blinks C on: 06/17/2022 07:55 PM   Modules accepted: Orders

## 2022-06-20 MED ORDER — WEGOVY 0.25 MG/0.5ML ~~LOC~~ SOAJ
0.2500 mg | SUBCUTANEOUS | 3 refills | Status: DC
Start: 2022-06-20 — End: 2022-10-20

## 2022-06-20 NOTE — Addendum Note (Signed)
Addended by: Lamar Blinks C on: 06/20/2022 12:07 PM   Modules accepted: Orders

## 2022-06-24 ENCOUNTER — Other Ambulatory Visit: Payer: Self-pay | Admitting: Family Medicine

## 2022-07-01 DIAGNOSIS — F4323 Adjustment disorder with mixed anxiety and depressed mood: Secondary | ICD-10-CM | POA: Diagnosis not present

## 2022-07-01 NOTE — Progress Notes (Unsigned)
[facility] at [facility] [address] [place], [place] [address] [phone]  Date:  07/03/2022   Name:  Selena Chapman   DOB:  [birth year]   MRN:  [id number]  PCP:  Selena Cables, MD    Chief Complaint: No chief complaint on file.   History of Present Illness:  Selena Chapman is a 51 y.o. very pleasant female patient who presents with the following:  Virtual visit today to discuss asthma Last seen by myself in May for a sore throat Pt location is home and my location is office Pt identify confirmed with 2 factors, she gives consent for a virtual visit today. The pt and myself are present on the call today   Today pt notes her asthma has been more severe over the last 8 weeks or so; she attributes this to her fall allergies It has been a really bad allergy season for her; going outdoors tends to make her symptoms worse  She has been using her albuterol several x a day- in general she would use her albuterol maybe just once a month  She had some left over prednisone - 20 mg daily for 5 days- this helped her a lot She is now down to using her albuterol once a day since she did the pred; this helped her a lot.  She finished the pred up today  She tested for covid and was negative She may cough up a little mucus on occasion but mostly the cough is dry.  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   Acquired hypothyroidism 12/29/2017   Family history of colon cancer 12/29/2017   Cough 10/10/2017   History of asthma 10/10/2017   History of vitamin D deficiency 08/11/2014   Hyperlipidemia 08/11/2014   Obesity 08/11/2014   Asthma 09/23/2011   Environmental allergies 09/23/2011   Depression     Past Medical History:  Diagnosis Date   Allergy    Anxiety     Asthma    Bipolar affect, depressed ([facility])    Bipolar disorder ([facility]) 06/03/2019   Depression    Physical exam, annual 06/03/2019   Plantar fasciitis     Past Surgical History:  Procedure Laterality Date   APPENDECTOMY      Social History   Tobacco Use   Smoking status: Never   Smokeless tobacco: Never  Substance Use Topics   Alcohol use: No   Drug use: No    Family History  Problem Relation Age of Onset   Hypertension Mother    Depression Sister     No Known Allergies  Medication list has been reviewed and updated.  Current Outpatient Medications on File Prior to Visit  Medication Sig Dispense Refill   albuterol (VENTOLIN HFA) 108 (90 Base) MCG/ACT inhaler Inhale 2 puffs into the lungs every 6 (six) hours as needed for wheezing or shortness of breath. 1 each 2   carbamazepine (TEGRETOL) 200 MG tablet Take 400 mg by mouth daily.     clonazePAM (KLONOPIN) 1 MG tablet Take 1 tablet (1 mg total) by mouth 3 (three) times daily as needed for anxiety. for anxiety (Patient taking differently: Take 1 mg by mouth as needed for anxiety. for anxiety) 90 tablet 1   D3-1000 25 MCG (1000  UT) capsule Take 1,000 Units by mouth daily.     fexofenadine (ALLEGRA) 180 MG tablet Take 180 mg by mouth daily.     lamoTRIgine (LAMICTAL) 200 MG tablet Take 200 mg by mouth in the morning and at bedtime.      levothyroxine (SYNTHROID) 50 MCG tablet TAKE 1 TABLET(50 MCG) BY MOUTH DAILY BEFORE BREAKFAST 90 tablet 0   Norethindrone Acetate-Ethinyl Estradiol (HAILEY 1.5/30) 1.5-30 MG-MCG tablet Take 1 tablet by mouth daily. 84 tablet 1   penicillin v potassium (VEETID) 500 MG tablet Take 1 tablet (500 mg total) by mouth 2 (two) times daily. 20 tablet 0   phentermine 15 MG capsule Take 1 capsule (15 mg total) by mouth every morning. 30 capsule 2   Semaglutide-Weight Management (WEGOVY) 0.25 MG/0.5ML SOAJ Inject 0.25 mg into the skin once a week. Increase to 0.5 mg weekly after 4 weeks 2 mL 3   No  current facility-administered medications on file prior to visit.    Review of Systems:  As per HPI- otherwise negative.   Physical Examination: There were no vitals filed for this visit. There were no vitals filed for this visit. There is no height or weight on file to calculate BMI. Ideal Body Weight:   Pt seen over video monitor- she looks well, no distress.  We had to change to phone only as the sound would not work No SOB    Assessment and Plan: Moderate persistent asthma with acute exacerbation - Plan: beclomethasone (QVAR REDIHALER) 80 MCG/ACT inhaler, predniSONE (DELTASONE) 20 MG tablet  Virtual visit today to discuss worsening of her moderate persistent asthma.  She recently took a 5-day course of prednisone which improved her symptoms.  We will have her start on inhaled steroid, start with Qvar 80 twice daily.  We will have her continue albuterol rescue as needed.  I have asked her to let me know how she responds to the inhaled steroid.  I did provide some prednisone for her to have on hand as she will be traveling soon and is worried about getting sick while out of town  I spoke with pt on the telephone for 11 minutes  Signed Lamar Blinks, MD

## 2022-07-03 ENCOUNTER — Encounter: Payer: Self-pay | Admitting: Family Medicine

## 2022-07-03 ENCOUNTER — Telehealth (INDEPENDENT_AMBULATORY_CARE_PROVIDER_SITE_OTHER): Payer: BC Managed Care – PPO | Admitting: Family Medicine

## 2022-07-03 DIAGNOSIS — J4541 Moderate persistent asthma with (acute) exacerbation: Secondary | ICD-10-CM | POA: Diagnosis not present

## 2022-07-03 MED ORDER — PREDNISONE 20 MG PO TABS: ORAL_TABLET | ORAL | 0 refills | Status: DC

## 2022-07-03 MED ORDER — QVAR REDIHALER 80 MCG/ACT IN AERB: 1.0000 | INHALATION_SPRAY | Freq: Two times a day (BID) | RESPIRATORY_TRACT | 5 refills | Status: DC

## 2022-07-04 ENCOUNTER — Telehealth: Payer: Self-pay

## 2022-07-04 ENCOUNTER — Telehealth: Payer: Self-pay | Admitting: Family Medicine

## 2022-07-04 MED ORDER — FLUTICASONE PROPIONATE HFA 110 MCG/ACT IN AERO: 1.0000 | INHALATION_SPRAY | Freq: Two times a day (BID) | RESPIRATORY_TRACT | 12 refills | Status: DC

## 2022-07-04 NOTE — Addendum Note (Signed)
Addended by: Abbe Amsterdam C on: 07/04/2022 12:29 PM   Modules accepted: Orders

## 2022-07-04 NOTE — Telephone Encounter (Signed)
Patient called to see if another provider in the office can assist with prior authorization for her beclomethasone (QVAR REDIHALER) 80 MCG/ACT inhaler [[id number]]  because it requires PA and Dr. Patsy Lager is not in the office. Patient is going out of town tomorrow and needs to pick it up. Please call patient to advise

## 2022-07-04 NOTE — Telephone Encounter (Signed)
See duplicate TE.

## 2022-07-04 NOTE — Telephone Encounter (Signed)
While starting the PA form via CoverMyMeds- a list of inhalers that would not need a PA was provided.   They are:  Advair HFA AirDuo Digihaler and Respiclick Alvesco Arnuity Ellipta Asmanex HFA and Twisthaler 220 mcg Breo Ellipta Flovent Diskus and HFA Fluticasone Propinate HFA Pulmicort Flexhaler Spiriva Respimat 1.25 mcg Trelegy Ellipta  Would you like to use one of these instead? Or proceed with original PA?

## 2022-07-22 DIAGNOSIS — F4323 Adjustment disorder with mixed anxiety and depressed mood: Secondary | ICD-10-CM | POA: Diagnosis not present

## 2022-07-24 ENCOUNTER — Encounter (INDEPENDENT_AMBULATORY_CARE_PROVIDER_SITE_OTHER): Payer: BC Managed Care – PPO | Admitting: Family Medicine

## 2022-07-24 DIAGNOSIS — U071 COVID-19: Secondary | ICD-10-CM | POA: Diagnosis not present

## 2022-07-24 MED ORDER — MOLNUPIRAVIR EUA 200MG CAPSULE: 4.0000 | ORAL_CAPSULE | Freq: Two times a day (BID) | ORAL | 0 refills | Status: AC

## 2022-07-24 NOTE — Telephone Encounter (Signed)
Please see the MyChart message reply(ies) for my assessment and plan.  The patient gave consent for this Medical Advice Message and is aware that it may result in a bill to their insurance company as well as the possibility that this may result in a co-payment or deductible. They are an established patient, but are not seeking medical advice exclusively about a problem treated during an in person or video visit in the last 7 days. I did not recommend an in person or video visit within 7 days of my reply.  I spent a total of 10 minutes cumulative time within 7 days through MyChart messaging Chiara Coltrin, MD  

## 2022-08-06 DIAGNOSIS — F4323 Adjustment disorder with mixed anxiety and depressed mood: Secondary | ICD-10-CM | POA: Diagnosis not present

## 2022-08-12 DIAGNOSIS — F4323 Adjustment disorder with mixed anxiety and depressed mood: Secondary | ICD-10-CM | POA: Diagnosis not present

## 2022-08-26 ENCOUNTER — Encounter (INDEPENDENT_AMBULATORY_CARE_PROVIDER_SITE_OTHER): Payer: BC Managed Care – PPO | Admitting: Family Medicine

## 2022-08-26 DIAGNOSIS — R054 Cough syncope: Secondary | ICD-10-CM

## 2022-08-27 NOTE — Telephone Encounter (Signed)
Please see the MyChart message reply(ies) for my assessment and plan.  The patient gave consent for this Medical Advice Message and is aware that it may result in a bill to their insurance company as well as the possibility that this may result in a co-payment or deductible. They are an established patient, but are not seeking medical advice exclusively about a problem treated during an in person or video visit in the last 7 days. I did not recommend an in person or video visit within 7 days of my reply.  I spent a total of 8 minutes cumulative time within 7 days through MyChart messaging Douglass Dunshee, MD  

## 2022-09-02 DIAGNOSIS — F4323 Adjustment disorder with mixed anxiety and depressed mood: Secondary | ICD-10-CM | POA: Diagnosis not present

## 2022-09-04 ENCOUNTER — Telehealth: Payer: Self-pay | Admitting: Family Medicine

## 2022-09-04 NOTE — Telephone Encounter (Signed)
Pt states the inhaler pcp had sent in a couple weeks ago was not covered by ins so she wanted to let us know they cover Pulmicort and Asmanex. She stated she went ahead and paid OOP for the inhaler sent in but she would like one of the other options sent in when she is due for a refill.

## 2022-09-04 NOTE — Telephone Encounter (Signed)
Okay for alt drug?

## 2022-09-05 ENCOUNTER — Encounter: Payer: Self-pay | Admitting: Family Medicine

## 2022-09-05 MED ORDER — PULMICORT FLEXHALER 90 MCG/ACT IN AEPB: 1.0000 | INHALATION_SPRAY | Freq: Two times a day (BID) | RESPIRATORY_TRACT | 6 refills | Status: AC

## 2022-09-12 DIAGNOSIS — F4323 Adjustment disorder with mixed anxiety and depressed mood: Secondary | ICD-10-CM | POA: Diagnosis not present

## 2022-09-16 ENCOUNTER — Other Ambulatory Visit: Payer: Self-pay | Admitting: Family Medicine

## 2022-09-16 DIAGNOSIS — E039 Hypothyroidism, unspecified: Secondary | ICD-10-CM

## 2022-09-17 ENCOUNTER — Other Ambulatory Visit: Payer: Self-pay | Admitting: Family Medicine

## 2022-09-17 DIAGNOSIS — E039 Hypothyroidism, unspecified: Secondary | ICD-10-CM

## 2022-09-24 DIAGNOSIS — F4323 Adjustment disorder with mixed anxiety and depressed mood: Secondary | ICD-10-CM | POA: Diagnosis not present

## 2022-10-20 NOTE — Patient Instructions (Addendum)
It was great to see again today, I will be in touch with your labs soon as possible  You are due for cologuard in September of this year- let me know and I will order it for you  You might look into weight loss surgery as a permanent weight loss solution

## 2022-10-20 NOTE — Progress Notes (Signed)
[place] at [facility] [place], [place], [place] [address] [phone] [id number] [phone]  Date:  10/28/2022   Name:  Selena Chapman   DOB:  10-Aug-1971   MRN:  [id number]  PCP:  Darreld Mclean, MD    Chief Complaint: Annual Exam (Concerns/ questions: discuss [phone]- is it worth staring /)   History of Present Illness:  Selena Chapman is a 52 y.o. very pleasant female patient who presents with the following:  Patient seen today for physical exam Most recent visit with myself was in November History of vitamin D deficiency, asthma and allergies, hypothyroidism, bipolar disorder, obesity She notes a lot of job stressors over the last 6 months or so but things are going well so far   Cologuard due for update later this year- September  Pap is up-to-date Mammogram due in June Lab work done in May, can update today- fasting today   She is trying to work out at [employer] and be more active in general  She has struggled with her weight for years We discussed gastric bypass   Albuterol, Pulmicort Tegretol, Lamictal Levothyroxine 50 Phentermine- she is NOT taking due to worsening anxiety  OCP  Wt Readings from Last 3 Encounters:  10/28/22 190 lb (86.2 kg)  01/16/22 194 lb 3.2 oz (88.1 kg)  01/09/22 191 lb 12.8 oz (87 kg)    Patient Active Problem List   Diagnosis Date Noted   Vitamin D deficiency 11/18/2020   Physical exam, annual 06/03/2019   Bipolar disorder ([place]) 06/03/2019   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   Acquired hypothyroidism 12/29/2017   Family history of colon cancer 12/29/2017   Cough 10/10/2017   History of asthma 10/10/2017   History of vitamin D deficiency 08/11/2014   Hyperlipidemia 08/11/2014   Obesity 08/11/2014   Asthma 09/23/2011   Environmental allergies 09/23/2011   Depression     Past Medical History:  Diagnosis Date   Allergy    Anxiety    Asthma    Bipolar affect, depressed ([place])    Bipolar  disorder ([place]) 06/03/2019   Depression    Physical exam, annual 06/03/2019   Plantar fasciitis     Past Surgical History:  Procedure Laterality Date   APPENDECTOMY      Social History   Tobacco Use   Smoking status: Never   Smokeless tobacco: Never  Substance Use Topics   Alcohol use: No   Drug use: No    Family History  Problem Relation Age of Onset   Hypertension Mother    Depression Sister     No Known Allergies  Medication list has been reviewed and updated.  Current Outpatient Medications on File Prior to Visit  Medication Sig Dispense Refill   albuterol (VENTOLIN HFA) 108 (90 Base) MCG/ACT inhaler Inhale 2 puffs into the lungs every 6 (six) hours as needed for wheezing or shortness of breath. 1 each 2   Budesonide (PULMICORT FLEXHALER) 90 MCG/ACT inhaler Inhale 1-2 puffs into the lungs 2 (two) times daily. 1 each 6   carbamazepine (TEGRETOL) 200 MG tablet Take 400 mg by mouth daily.     D3-1000 25 MCG (1000 UT) capsule Take 1,000 Units by mouth daily.     fexofenadine (ALLEGRA) 180 MG tablet Take 180 mg by mouth daily.     lamoTRIgine (LAMICTAL) 200 MG tablet Take 200 mg by mouth in the morning and at bedtime.      levothyroxine (SYNTHROID)  50 MCG tablet Take 1 tablet (50 mcg total) by mouth daily before breakfast. Needs updated lab work for future refills. 30 tablet 0   Norethindrone Acetate-Ethinyl Estradiol (HAILEY 1.5/30) 1.5-30 MG-MCG tablet Take 1 tablet by mouth daily. 84 tablet 1   clonazePAM (KLONOPIN) 1 MG tablet Take 1 tablet (1 mg total) by mouth 3 (three) times daily as needed for anxiety. for anxiety (Patient taking differently: Take 1 mg by mouth as needed for anxiety. for anxiety) 90 tablet 1   No current facility-administered medications on file prior to visit.    Review of Systems:  As per HPI- otherwise negative.  Physical Examination: Vitals:   10/28/22 0817  BP: 110/60  Pulse: 62  Resp: 18  Temp: 97.6 F (36.4 C)  SpO2: 99%    Vitals:   10/28/22 0817  Weight: 190 lb (86.2 kg)  Height: 5' 3.5" (1.613 m)   Body mass index is 33.13 kg/m. Ideal Body Weight: Weight in (lb) to have BMI = 25: 143.1  GEN: no acute distress.  Mildly obese, looks well otherwise  HEENT: Atraumatic, Normocephalic.  Bilateral TM wnl, oropharynx normal.  PEERL,EOMI.   Ears and Nose: No external deformity. CV: RRR, No M/G/R. No JVD. No thrill. No extra heart sounds. PULM: CTA B, no wheezes, crackles, rhonchi. No retractions. No resp. distress. No accessory muscle use. ABD: S, NT, ND, +BS. No rebound. No HSM. EXTR: No c/c/e PSYCH: Normally interactive. Conversant.    Assessment and Plan: Physical exam  Moderate persistent asthma with acute exacerbation - Plan: CBC  Acquired hypothyroidism - Plan: TSH  Pure hypercholesterolemia - Plan: Lipid panel  Screening for diabetes mellitus - Plan: Hemoglobin A1c  Medication monitoring encounter - Plan: CBC, Comprehensive metabolic panel  Class 1 obesity with serious comorbidity and body mass index (BMI) of 33.0 to 33.9 in adult, unspecified obesity type  Physical exam today.  Encouraged healthy diet and exercise routine Pt has struggled with her weight all her life, has been up and down over 100lbs at various times We discussed weight loss surgery and she is thinking about this- she will let me know if she needs a referral  Will plan further follow- up pending labs. Plan to update cologuard and pap this fall    Signed Lamar Blinks, MD   Received labs as follows, message to patient  Results for orders placed or performed in visit on 10/28/22  CBC  Result Value Ref Range   WBC 5.1 4.0 - 10.5 K/uL   RBC 4.16 3.87 - 5.11 Mil/uL   Platelets 229.0 150.0 - 400.0 K/uL   Hemoglobin 13.3 12.0 - 15.0 g/dL   HCT 39.0 36.0 - 46.0 %   MCV 93.8 78.0 - 100.0 fl   MCHC 34.2 30.0 - 36.0 g/dL   RDW 13.4 11.5 - 15.5 %  Hemoglobin A1c  Result Value Ref Range   Hgb A1c MFr Bld 4.9 4.6 -  6.5 %  Lipid panel  Result Value Ref Range   Cholesterol 312 (H) 0 - 200 mg/dL   Triglycerides 101.0 0.0 - 149.0 mg/dL   HDL 88.50 >39.00 mg/dL   VLDL 20.2 0.0 - 40.0 mg/dL   LDL Cholesterol 203 (H) 0 - 99 mg/dL   Total CHOL/HDL Ratio 4    NonHDL 223.61   TSH  Result Value Ref Range   TSH 3.27 0.35 - 5.50 uIU/mL  Comprehensive metabolic panel  Result Value Ref Range   Sodium 138 135 - 145 mEq/L  Potassium 4.6 3.5 - 5.1 mEq/L   Chloride 103 96 - 112 mEq/L   CO2 26 19 - 32 mEq/L   Glucose, Bld 71 70 - 99 mg/dL   BUN 13 6 - 23 mg/dL   Creatinine, Ser 0.82 0.40 - 1.20 mg/dL   Total Bilirubin 0.5 0.2 - 1.2 mg/dL   Alkaline Phosphatase 36 (L) 39 - 117 U/L   AST 16 0 - 37 U/L   ALT 15 0 - 35 U/L   Total Protein 6.2 6.0 - 8.3 g/dL   Albumin 3.8 3.5 - 5.2 g/dL   GFR 83.69 >60.00 mL/min   Calcium 9.1 8.4 - 10.5 mg/dL

## 2022-10-22 DIAGNOSIS — F4323 Adjustment disorder with mixed anxiety and depressed mood: Secondary | ICD-10-CM | POA: Diagnosis not present

## 2022-10-28 ENCOUNTER — Ambulatory Visit (INDEPENDENT_AMBULATORY_CARE_PROVIDER_SITE_OTHER): Payer: BC Managed Care – PPO | Admitting: Family Medicine

## 2022-10-28 ENCOUNTER — Encounter: Payer: Self-pay | Admitting: Family Medicine

## 2022-10-28 ENCOUNTER — Other Ambulatory Visit: Payer: Self-pay | Admitting: Family Medicine

## 2022-10-28 ENCOUNTER — Other Ambulatory Visit: Payer: Self-pay

## 2022-10-28 VITALS — BP 110/60 | HR 62 | Temp 97.6°F | Resp 18 | Ht 63.5 in | Wt 190.0 lb

## 2022-10-28 DIAGNOSIS — E7800 Pure hypercholesterolemia, unspecified: Secondary | ICD-10-CM | POA: Diagnosis not present

## 2022-10-28 DIAGNOSIS — E039 Hypothyroidism, unspecified: Secondary | ICD-10-CM

## 2022-10-28 DIAGNOSIS — Z131 Encounter for screening for diabetes mellitus: Secondary | ICD-10-CM

## 2022-10-28 DIAGNOSIS — Z5181 Encounter for therapeutic drug level monitoring: Secondary | ICD-10-CM | POA: Diagnosis not present

## 2022-10-28 DIAGNOSIS — Z6833 Body mass index (BMI) 33.0-33.9, adult: Secondary | ICD-10-CM

## 2022-10-28 DIAGNOSIS — Z0000 Encounter for general adult medical examination without abnormal findings: Secondary | ICD-10-CM

## 2022-10-28 DIAGNOSIS — J4541 Moderate persistent asthma with (acute) exacerbation: Secondary | ICD-10-CM | POA: Diagnosis not present

## 2022-10-28 DIAGNOSIS — E669 Obesity, unspecified: Secondary | ICD-10-CM

## 2022-10-28 LAB — CBC
HCT: 39 % (ref 36.0–46.0)
Hemoglobin: 13.3 g/dL (ref 12.0–15.0)
MCHC: 34.2 g/dL (ref 30.0–36.0)
MCV: 93.8 fl (ref 78.0–100.0)
Platelets: 229 10*3/uL (ref 150.0–400.0)
RBC: 4.16 Mil/uL (ref 3.87–5.11)
RDW: 13.4 % (ref 11.5–15.5)
WBC: 5.1 10*3/uL (ref 4.0–10.5)

## 2022-10-28 LAB — LIPID PANEL
Cholesterol: 312 mg/dL — ABNORMAL HIGH (ref 0–200)
HDL: 88.5 mg/dL (ref >39.00)
LDL Cholesterol: 203 mg/dL — ABNORMAL HIGH (ref 0–99)
NonHDL: 223.61
Total CHOL/HDL Ratio: 4
Triglycerides: 101 mg/dL (ref 0.0–149.0)
VLDL: 20.2 mg/dL (ref 0.0–40.0)

## 2022-10-28 LAB — COMPREHENSIVE METABOLIC PANEL
ALT: 15 U/L (ref 0–35)
AST: 16 U/L (ref 0–37)
Albumin: 3.8 g/dL (ref 3.5–5.2)
Alkaline Phosphatase: 36 U/L — ABNORMAL LOW (ref 39–117)
BUN: 13 mg/dL (ref 6–23)
CO2: 26 mEq/L (ref 19–32)
Calcium: 9.1 mg/dL (ref 8.4–10.5)
Chloride: 103 mEq/L (ref 96–112)
Creatinine, Ser: 0.82 mg/dL (ref 0.40–1.20)
GFR: 83.69 mL/min (ref >60.00)
Glucose, Bld: 71 mg/dL (ref 70–99)
Potassium: 4.6 mEq/L (ref 3.5–5.1)
Sodium: 138 mEq/L (ref 135–145)
Total Bilirubin: 0.5 mg/dL (ref 0.2–1.2)
Total Protein: 6.2 g/dL (ref 6.0–8.3)

## 2022-10-28 LAB — HEMOGLOBIN A1C: Hgb A1c MFr Bld: 4.9 % (ref 4.6–6.5)

## 2022-10-28 LAB — TSH: TSH: 3.27 u[IU]/mL (ref 0.35–5.50)

## 2022-10-28 MED ORDER — LEVOTHYROXINE SODIUM 50 MCG PO TABS: 50.0000 ug | ORAL_TABLET | Freq: Every day | ORAL | 3 refills | Status: DC

## 2022-11-04 DIAGNOSIS — F4323 Adjustment disorder with mixed anxiety and depressed mood: Secondary | ICD-10-CM | POA: Diagnosis not present

## 2022-11-05 DIAGNOSIS — F3176 Bipolar disorder, in full remission, most recent episode depressed: Secondary | ICD-10-CM | POA: Diagnosis not present

## 2022-11-05 DIAGNOSIS — F3174 Bipolar disorder, in full remission, most recent episode manic: Secondary | ICD-10-CM | POA: Diagnosis not present

## 2022-11-05 DIAGNOSIS — F411 Generalized anxiety disorder: Secondary | ICD-10-CM | POA: Diagnosis not present

## 2022-11-13 DIAGNOSIS — F4323 Adjustment disorder with mixed anxiety and depressed mood: Secondary | ICD-10-CM | POA: Diagnosis not present

## 2022-11-28 ENCOUNTER — Other Ambulatory Visit: Payer: Self-pay | Admitting: Family Medicine

## 2022-12-04 DIAGNOSIS — F4323 Adjustment disorder with mixed anxiety and depressed mood: Secondary | ICD-10-CM | POA: Diagnosis not present

## 2022-12-11 DIAGNOSIS — F4323 Adjustment disorder with mixed anxiety and depressed mood: Secondary | ICD-10-CM | POA: Diagnosis not present

## 2022-12-20 ENCOUNTER — Other Ambulatory Visit: Payer: Self-pay | Admitting: Family Medicine

## 2023-01-01 DIAGNOSIS — F4323 Adjustment disorder with mixed anxiety and depressed mood: Secondary | ICD-10-CM | POA: Diagnosis not present

## 2023-01-06 DIAGNOSIS — F3176 Bipolar disorder, in full remission, most recent episode depressed: Secondary | ICD-10-CM | POA: Diagnosis not present

## 2023-01-06 DIAGNOSIS — F411 Generalized anxiety disorder: Secondary | ICD-10-CM | POA: Diagnosis not present

## 2023-01-06 DIAGNOSIS — F3173 Bipolar disorder, in partial remission, most recent episode manic: Secondary | ICD-10-CM | POA: Diagnosis not present

## 2023-01-30 ENCOUNTER — Other Ambulatory Visit: Payer: Self-pay

## 2023-01-30 MED ORDER — ALBUTEROL SULFATE HFA 108 (90 BASE) MCG/ACT IN AERS
2.0000 | INHALATION_SPRAY | Freq: Four times a day (QID) | RESPIRATORY_TRACT | 3 refills | Status: AC | PRN
Start: 2023-01-30 — End: ?

## 2023-02-05 DIAGNOSIS — F4323 Adjustment disorder with mixed anxiety and depressed mood: Secondary | ICD-10-CM | POA: Diagnosis not present

## 2023-04-04 ENCOUNTER — Encounter: Payer: Self-pay | Admitting: Family Medicine

## 2023-04-04 DIAGNOSIS — E039 Hypothyroidism, unspecified: Secondary | ICD-10-CM

## 2023-04-04 DIAGNOSIS — Z1211 Encounter for screening for malignant neoplasm of colon: Secondary | ICD-10-CM

## 2023-04-04 DIAGNOSIS — Z5181 Encounter for therapeutic drug level monitoring: Secondary | ICD-10-CM

## 2023-04-04 NOTE — Telephone Encounter (Signed)

## 2023-04-14 DIAGNOSIS — F4323 Adjustment disorder with mixed anxiety and depressed mood: Secondary | ICD-10-CM | POA: Diagnosis not present

## 2023-04-23 DIAGNOSIS — F4323 Adjustment disorder with mixed anxiety and depressed mood: Secondary | ICD-10-CM | POA: Diagnosis not present

## 2023-05-06 ENCOUNTER — Telehealth: Payer: Self-pay | Admitting: Family Medicine

## 2023-05-06 DIAGNOSIS — F4323 Adjustment disorder with mixed anxiety and depressed mood: Secondary | ICD-10-CM | POA: Diagnosis not present

## 2023-05-06 NOTE — Telephone Encounter (Signed)
Form has been faxed back to [facility]

## 2023-05-06 NOTE — Telephone Encounter (Signed)
Tobi Bastos with [facility] called to see if the provider is okay with the manufacturer change on patient's levothyroxine. They faxed over a form. Please return form or call them to advise if this change is ok

## 2023-05-08 ENCOUNTER — Other Ambulatory Visit (INDEPENDENT_AMBULATORY_CARE_PROVIDER_SITE_OTHER): Payer: BC Managed Care – PPO

## 2023-05-08 ENCOUNTER — Encounter: Payer: Self-pay | Admitting: Family Medicine

## 2023-05-08 DIAGNOSIS — Z5181 Encounter for therapeutic drug level monitoring: Secondary | ICD-10-CM

## 2023-05-08 DIAGNOSIS — E039 Hypothyroidism, unspecified: Secondary | ICD-10-CM

## 2023-05-08 LAB — COMPREHENSIVE METABOLIC PANEL
ALT: 13 U/L (ref 0–35)
AST: 16 U/L (ref 0–37)
Albumin: 3.8 g/dL (ref 3.5–5.2)
Alkaline Phosphatase: 41 U/L (ref 39–117)
BUN: 9 mg/dL (ref 6–23)
CO2: 26 meq/L (ref 19–32)
Calcium: 8.8 mg/dL (ref 8.4–10.5)
Chloride: 98 meq/L (ref 96–112)
Creatinine, Ser: 0.74 mg/dL (ref 0.40–1.20)
GFR: 94.32 mL/min (ref >60.00)
Glucose, Bld: 76 mg/dL (ref 70–99)
Potassium: 4.2 meq/L (ref 3.5–5.1)
Sodium: 133 meq/L — ABNORMAL LOW (ref 135–145)
Total Bilirubin: 0.5 mg/dL (ref 0.2–1.2)
Total Protein: 6.1 g/dL (ref 6.0–8.3)

## 2023-05-08 LAB — TSH: TSH: 3.45 u[IU]/mL (ref 0.35–5.50)

## 2023-05-09 DIAGNOSIS — Z1211 Encounter for screening for malignant neoplasm of colon: Secondary | ICD-10-CM | POA: Diagnosis not present

## 2023-05-09 NOTE — Telephone Encounter (Signed)
Labs have been printed.

## 2023-05-13 DIAGNOSIS — F3175 Bipolar disorder, in partial remission, most recent episode depressed: Secondary | ICD-10-CM | POA: Diagnosis not present

## 2023-05-13 DIAGNOSIS — F411 Generalized anxiety disorder: Secondary | ICD-10-CM | POA: Diagnosis not present

## 2023-05-13 DIAGNOSIS — F3174 Bipolar disorder, in full remission, most recent episode manic: Secondary | ICD-10-CM | POA: Diagnosis not present

## 2023-05-14 ENCOUNTER — Encounter: Payer: Self-pay | Admitting: Family Medicine

## 2023-05-20 DIAGNOSIS — F4323 Adjustment disorder with mixed anxiety and depressed mood: Secondary | ICD-10-CM | POA: Diagnosis not present

## 2023-06-03 DIAGNOSIS — F4323 Adjustment disorder with mixed anxiety and depressed mood: Secondary | ICD-10-CM | POA: Diagnosis not present

## 2023-06-09 ENCOUNTER — Other Ambulatory Visit: Payer: Self-pay | Admitting: Family Medicine

## 2023-06-09 DIAGNOSIS — Z1231 Encounter for screening mammogram for malignant neoplasm of breast: Secondary | ICD-10-CM

## 2023-06-11 DIAGNOSIS — F3181 Bipolar II disorder: Secondary | ICD-10-CM | POA: Diagnosis not present

## 2023-06-11 DIAGNOSIS — F411 Generalized anxiety disorder: Secondary | ICD-10-CM | POA: Diagnosis not present

## 2023-06-12 DIAGNOSIS — F4323 Adjustment disorder with mixed anxiety and depressed mood: Secondary | ICD-10-CM | POA: Diagnosis not present

## 2023-06-23 DIAGNOSIS — F4323 Adjustment disorder with mixed anxiety and depressed mood: Secondary | ICD-10-CM | POA: Diagnosis not present

## 2023-07-09 ENCOUNTER — Ambulatory Visit
Admission: RE | Admit: 2023-07-09 | Discharge: 2023-07-09 | Disposition: A | Discharge status: Home / Self Care | Payer: BC Managed Care – PPO | Source: Ambulatory Visit | Attending: Family Medicine | Admitting: Family Medicine

## 2023-07-09 DIAGNOSIS — Z1231 Encounter for screening mammogram for malignant neoplasm of breast: Secondary | ICD-10-CM | POA: Diagnosis not present

## 2023-07-30 DIAGNOSIS — F4323 Adjustment disorder with mixed anxiety and depressed mood: Secondary | ICD-10-CM | POA: Diagnosis not present

## 2023-08-13 DIAGNOSIS — F4323 Adjustment disorder with mixed anxiety and depressed mood: Secondary | ICD-10-CM | POA: Diagnosis not present

## 2023-09-10 DIAGNOSIS — F4323 Adjustment disorder with mixed anxiety and depressed mood: Secondary | ICD-10-CM | POA: Diagnosis not present

## 2023-09-24 ENCOUNTER — Encounter (INDEPENDENT_AMBULATORY_CARE_PROVIDER_SITE_OTHER): Payer: BC Managed Care – PPO | Admitting: Family Medicine

## 2023-09-24 DIAGNOSIS — E7800 Pure hypercholesterolemia, unspecified: Secondary | ICD-10-CM | POA: Diagnosis not present

## 2023-09-24 DIAGNOSIS — E559 Vitamin D deficiency, unspecified: Secondary | ICD-10-CM

## 2023-09-24 DIAGNOSIS — E039 Hypothyroidism, unspecified: Secondary | ICD-10-CM

## 2023-09-24 DIAGNOSIS — R300 Dysuria: Secondary | ICD-10-CM

## 2023-09-24 DIAGNOSIS — Z130 Encounter for screening for diseases of the blood and blood-forming organs and certain disorders involving the immune mechanism: Secondary | ICD-10-CM | POA: Diagnosis not present

## 2023-09-24 DIAGNOSIS — Z131 Encounter for screening for diabetes mellitus: Secondary | ICD-10-CM

## 2023-09-24 DIAGNOSIS — F4323 Adjustment disorder with mixed anxiety and depressed mood: Secondary | ICD-10-CM | POA: Diagnosis not present

## 2023-09-24 NOTE — Telephone Encounter (Signed)
Please see the MyChart message reply(ies) for my assessment and plan.  The patient gave consent for this Medical Advice Message and is aware that it may result in a bill to their insurance company as well as the possibility that this may result in a co-payment or deductible. They are an established patient, but are not seeking medical advice exclusively about a problem treated during an in person or video visit in the last 7 days. I did not recommend an in person or video visit within 7 days of my reply.  I spent a total of 15 minutes cumulative time within 7 days through [employer] Abbe Amsterdam, MD

## 2023-09-25 ENCOUNTER — Other Ambulatory Visit (INDEPENDENT_AMBULATORY_CARE_PROVIDER_SITE_OTHER): Payer: BC Managed Care – PPO

## 2023-09-25 ENCOUNTER — Encounter: Payer: Self-pay | Admitting: Family Medicine

## 2023-09-25 DIAGNOSIS — E7800 Pure hypercholesterolemia, unspecified: Secondary | ICD-10-CM | POA: Diagnosis not present

## 2023-09-25 DIAGNOSIS — E039 Hypothyroidism, unspecified: Secondary | ICD-10-CM | POA: Diagnosis not present

## 2023-09-25 DIAGNOSIS — R300 Dysuria: Secondary | ICD-10-CM

## 2023-09-25 DIAGNOSIS — Z130 Encounter for screening for diseases of the blood and blood-forming organs and certain disorders involving the immune mechanism: Secondary | ICD-10-CM | POA: Diagnosis not present

## 2023-09-25 DIAGNOSIS — Z131 Encounter for screening for diabetes mellitus: Secondary | ICD-10-CM

## 2023-09-25 DIAGNOSIS — E559 Vitamin D deficiency, unspecified: Secondary | ICD-10-CM

## 2023-09-25 LAB — COMPREHENSIVE METABOLIC PANEL
ALT: 13 U/L (ref 0–35)
AST: 15 U/L (ref 0–37)
Albumin: 4.5 g/dL (ref 3.5–5.2)
Alkaline Phosphatase: 50 U/L (ref 39–117)
BUN: 16 mg/dL (ref 6–23)
CO2: 28 meq/L (ref 19–32)
Calcium: 9.3 mg/dL (ref 8.4–10.5)
Chloride: 99 meq/L (ref 96–112)
Creatinine, Ser: 0.82 mg/dL (ref 0.40–1.20)
GFR: 83.16 mL/min (ref >60.00)
Glucose, Bld: 80 mg/dL (ref 70–99)
Potassium: 4.2 meq/L (ref 3.5–5.1)
Sodium: 135 meq/L (ref 135–145)
Total Bilirubin: 0.4 mg/dL (ref 0.2–1.2)
Total Protein: 6.9 g/dL (ref 6.0–8.3)

## 2023-09-25 LAB — POCT URINALYSIS DIP (MANUAL ENTRY)
Bilirubin, UA: NEGATIVE
Blood, UA: NEGATIVE
Glucose, UA: NEGATIVE mg/dL
Ketones, POC UA: NEGATIVE mg/dL
Nitrite, UA: NEGATIVE
Protein Ur, POC: NEGATIVE mg/dL
Spec Grav, UA: 1.015 (ref 1.010–1.025)
Urobilinogen, UA: 0.2 U/dL
pH, UA: 6.5 (ref 5.0–8.0)

## 2023-09-25 LAB — CBC
HCT: 43.8 % (ref 36.0–46.0)
Hemoglobin: 14.9 g/dL (ref 12.0–15.0)
MCHC: 34 g/dL (ref 30.0–36.0)
MCV: 95.4 fL (ref 78.0–100.0)
Platelets: 240 10*3/uL (ref 150.0–400.0)
RBC: 4.59 Mil/uL (ref 3.87–5.11)
RDW: 12.7 % (ref 11.5–15.5)
WBC: 4.8 10*3/uL (ref 4.0–10.5)

## 2023-09-25 LAB — LIPID PANEL
Cholesterol: 337 mg/dL — ABNORMAL HIGH (ref 0–200)
HDL: 88.5 mg/dL (ref >39.00)
LDL Cholesterol: 234 mg/dL — ABNORMAL HIGH (ref 0–99)
NonHDL: 248.12
Total CHOL/HDL Ratio: 4
Triglycerides: 72 mg/dL (ref 0.0–149.0)
VLDL: 14.4 mg/dL (ref 0.0–40.0)

## 2023-09-25 LAB — HEMOGLOBIN A1C: Hgb A1c MFr Bld: 5 % (ref 4.6–6.5)

## 2023-09-25 LAB — VITAMIN D 25 HYDROXY (VIT D DEFICIENCY, FRACTURES): VITD: 26.1 ng/mL — ABNORMAL LOW (ref 30.00–100.00)

## 2023-09-25 LAB — TSH: TSH: 2.78 u[IU]/mL (ref 0.35–5.50)

## 2023-09-25 NOTE — Addendum Note (Signed)
Addended by: Mervin Kung A on: 09/25/2023 09:07 AM   Modules accepted: Orders

## 2023-09-26 LAB — URINE CULTURE
MICRO NUMBER:: 16019838
Result:: NO GROWTH
SPECIMEN QUALITY:: ADEQUATE

## 2023-09-27 ENCOUNTER — Encounter: Payer: Self-pay | Admitting: Family Medicine

## 2023-10-15 DIAGNOSIS — F4323 Adjustment disorder with mixed anxiety and depressed mood: Secondary | ICD-10-CM | POA: Diagnosis not present

## 2023-10-28 ENCOUNTER — Encounter: Payer: Self-pay | Admitting: Family Medicine

## 2023-10-28 DIAGNOSIS — J4541 Moderate persistent asthma with (acute) exacerbation: Secondary | ICD-10-CM

## 2023-10-28 MED ORDER — QVAR REDIHALER 80 MCG/ACT IN AERB: 1.0000 | INHALATION_SPRAY | Freq: Two times a day (BID) | RESPIRATORY_TRACT | 5 refills | Status: AC

## 2023-10-29 DIAGNOSIS — F4323 Adjustment disorder with mixed anxiety and depressed mood: Secondary | ICD-10-CM | POA: Diagnosis not present

## 2023-11-19 ENCOUNTER — Other Ambulatory Visit: Payer: Self-pay | Admitting: Family Medicine

## 2023-11-19 DIAGNOSIS — F4323 Adjustment disorder with mixed anxiety and depressed mood: Secondary | ICD-10-CM | POA: Diagnosis not present

## 2023-11-20 ENCOUNTER — Other Ambulatory Visit: Payer: Self-pay | Admitting: Family Medicine

## 2023-11-25 DIAGNOSIS — F4322 Adjustment disorder with anxiety: Secondary | ICD-10-CM | POA: Diagnosis not present

## 2023-11-25 DIAGNOSIS — F3181 Bipolar II disorder: Secondary | ICD-10-CM | POA: Diagnosis not present

## 2023-11-25 DIAGNOSIS — F411 Generalized anxiety disorder: Secondary | ICD-10-CM | POA: Diagnosis not present

## 2023-12-03 DIAGNOSIS — F4323 Adjustment disorder with mixed anxiety and depressed mood: Secondary | ICD-10-CM | POA: Diagnosis not present

## 2023-12-12 NOTE — Patient Instructions (Incomplete)
 Good to see you again today

## 2023-12-12 NOTE — Progress Notes (Unsigned)
 Selena Chapman [address], [address] [place], [place] [address] [phone]  Date:  12/17/2023   Name:  Selena Chapman   DOB:  1970/10/07   MRN:  [id number]  PCP:  Kaylee Partridge, MD    Chief Complaint: No chief complaint on file.   History of Present Illness:  Selena Chapman is a 53 y.o. very pleasant female patient who presents with the following:  Pt seen today for a CPE Last seen by myself one year ago  History of vitamin D  deficiency, asthma and allergies, hypothyroidism, bipolar disorder, obesity  At our visit last year she was thinking about weight loss surgery   Shingles vaccine can be done Pap can be updated Mammo is UTD Labs done in January   Lab Results  Component Value Date   TSH 2.78 09/25/2023     Patient Active Problem List   Diagnosis Date Noted   Vitamin D  deficiency 11/18/2020   Physical exam, annual 06/03/2019   Bipolar disorder ([facility]) 06/03/2019   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   Acquired hypothyroidism 12/29/2017   Family history of colon cancer 12/29/2017   Cough 10/10/2017   History of asthma 10/10/2017   History of vitamin D  deficiency 08/11/2014   Hyperlipidemia 08/11/2014   Obesity 08/11/2014   Asthma 09/23/2011   Environmental allergies 09/23/2011   Depression     Past Medical History:  Diagnosis Date   Allergy    Anxiety    Asthma    Bipolar affect, depressed ([facility])    Bipolar disorder ([facility]) 06/03/2019   Depression    Physical exam, annual 06/03/2019   Plantar fasciitis     Past Surgical History:  Procedure Laterality Date   APPENDECTOMY      Social History   Tobacco Use   Smoking status: Never   Smokeless tobacco: Never  Substance Use Topics   Alcohol use: No   Drug use: No    Family History  Problem Relation Age of Onset   Hypertension Mother    Depression Sister     No Known Allergies  Medication list has been reviewed and updated.  Current  Outpatient Medications on File Prior to Visit  Medication Sig Dispense Refill   albuterol  (VENTOLIN  HFA) 108 (90 Base) MCG/ACT inhaler Inhale 2 puffs into the lungs every 6 (six) hours as needed for wheezing or shortness of breath. 6.7 g 3   beclomethasone (QVAR  REDIHALER) 80 MCG/ACT inhaler Inhale 1 puff into the lungs 2 (two) times daily. 1 each 5   Budesonide  (PULMICORT  FLEXHALER) 90 MCG/ACT inhaler Inhale 1-2 puffs into the lungs 2 (two) times daily. 1 each 6   carbamazepine (TEGRETOL) 200 MG tablet Take 400 mg by mouth daily.     clonazePAM  (KLONOPIN ) 1 MG tablet Take 1 tablet (1 mg total) by mouth 3 (three) times daily as needed for anxiety. for anxiety (Patient taking differently: Take 1 mg by mouth as needed for anxiety. for anxiety) 90 tablet 1   D3-1000 25 MCG (1000 UT) capsule Take 1,000 Units by mouth daily.     fexofenadine (ALLEGRA) 180 MG tablet Take 180 mg by mouth daily.     lamoTRIgine  (LAMICTAL ) 200 MG tablet Take 200 mg by mouth in the morning and at bedtime.      levothyroxine  (SYNTHROID ) 50 MCG tablet Take 1 tablet (50 mcg total) by mouth daily before breakfast. 90 tablet 3   Norethindrone  Acetate-Ethinyl Estradiol (HAILEY 1.5/30) 1.5-30  MG-MCG tablet Take 1 tablet by mouth daily. 28 tablet 0   No current facility-administered medications on file prior to visit.    Review of Systems:  As per HPI- otherwise negative.   Physical Examination: There were no vitals filed for this visit. There were no vitals filed for this visit. There is no height or weight on file to calculate BMI. Ideal Body Weight:    GEN: no acute distress. HEENT: Atraumatic, Normocephalic.  Ears and Nose: No external deformity. CV: RRR, No M/G/R. No JVD. No thrill. No extra heart sounds. PULM: CTA B, no wheezes, crackles, rhonchi. No retractions. No resp. distress. No accessory muscle use. ABD: S, NT, ND, +BS. No rebound. No HSM. EXTR: No c/c/e PSYCH: Normally interactive. Conversant.     Assessment and Plan: ***  Signed Gates Kasal, MD

## 2023-12-14 ENCOUNTER — Other Ambulatory Visit: Payer: Self-pay | Admitting: Family Medicine

## 2023-12-15 ENCOUNTER — Encounter: Admitting: Family Medicine

## 2023-12-17 ENCOUNTER — Other Ambulatory Visit (HOSPITAL_COMMUNITY)
Admission: RE | Admit: 2023-12-17 | Discharge: 2023-12-17 | Disposition: A | Discharge status: Home / Self Care | Source: Ambulatory Visit | Attending: Family Medicine | Admitting: Family Medicine

## 2023-12-17 ENCOUNTER — Ambulatory Visit (INDEPENDENT_AMBULATORY_CARE_PROVIDER_SITE_OTHER): Admitting: Family Medicine

## 2023-12-17 ENCOUNTER — Encounter: Payer: Self-pay | Admitting: Family Medicine

## 2023-12-17 VITALS — BP 122/78 | HR 71 | Temp 97.9°F | Resp 18 | Ht 63.5 in | Wt 170.4 lb

## 2023-12-17 DIAGNOSIS — Z124 Encounter for screening for malignant neoplasm of cervix: Secondary | ICD-10-CM

## 2023-12-17 DIAGNOSIS — N951 Menopausal and female climacteric states: Secondary | ICD-10-CM

## 2023-12-17 DIAGNOSIS — E039 Hypothyroidism, unspecified: Secondary | ICD-10-CM | POA: Diagnosis not present

## 2023-12-17 DIAGNOSIS — Z0000 Encounter for general adult medical examination without abnormal findings: Secondary | ICD-10-CM

## 2023-12-17 DIAGNOSIS — Z23 Encounter for immunization: Secondary | ICD-10-CM

## 2023-12-17 DIAGNOSIS — Z3041 Encounter for surveillance of contraceptive pills: Secondary | ICD-10-CM | POA: Diagnosis not present

## 2023-12-17 DIAGNOSIS — E7849 Other hyperlipidemia: Secondary | ICD-10-CM | POA: Diagnosis not present

## 2023-12-17 LAB — LIPID PANEL
Cholesterol: 320 mg/dL — ABNORMAL HIGH (ref 0–200)
HDL: 84.6 mg/dL (ref >39.00)
LDL Cholesterol: 221 mg/dL — ABNORMAL HIGH (ref 0–99)
NonHDL: 235.57
Total CHOL/HDL Ratio: 4
Triglycerides: 71 mg/dL (ref 0.0–149.0)
VLDL: 14.2 mg/dL (ref 0.0–40.0)

## 2023-12-17 LAB — FOLLICLE STIMULATING HORMONE: FSH: 40 m[IU]/mL

## 2023-12-17 MED ORDER — LEVOTHYROXINE SODIUM 50 MCG PO TABS: 50.0000 ug | ORAL_TABLET | Freq: Every day | ORAL | 3 refills | Status: AC

## 2023-12-17 MED ORDER — NORETHINDRONE ACET-ETHINYL EST 1.5-30 MG-MCG PO TABS: 1.0000 | ORAL_TABLET | Freq: Every day | ORAL | 3 refills | Status: DC

## 2023-12-18 ENCOUNTER — Encounter: Payer: Self-pay | Admitting: Family Medicine

## 2023-12-18 LAB — CYTOLOGY - PAP
Adequacy: ABSENT
Comment: NEGATIVE
Diagnosis: NEGATIVE
High risk HPV: NEGATIVE

## 2023-12-24 DIAGNOSIS — F4323 Adjustment disorder with mixed anxiety and depressed mood: Secondary | ICD-10-CM | POA: Diagnosis not present

## 2023-12-26 ENCOUNTER — Encounter: Payer: Self-pay | Admitting: Family Medicine

## 2023-12-31 NOTE — Addendum Note (Signed)
 Addended by: Gates Kasal C on: 12/31/2023 03:20 PM   Modules accepted: Orders

## 2024-01-07 DIAGNOSIS — F4323 Adjustment disorder with mixed anxiety and depressed mood: Secondary | ICD-10-CM | POA: Diagnosis not present

## 2024-01-28 DIAGNOSIS — F4323 Adjustment disorder with mixed anxiety and depressed mood: Secondary | ICD-10-CM | POA: Diagnosis not present

## 2024-02-04 ENCOUNTER — Other Ambulatory Visit (INDEPENDENT_AMBULATORY_CARE_PROVIDER_SITE_OTHER)

## 2024-02-04 ENCOUNTER — Ambulatory Visit (INDEPENDENT_AMBULATORY_CARE_PROVIDER_SITE_OTHER): Admitting: *Deleted

## 2024-02-04 ENCOUNTER — Ambulatory Visit

## 2024-02-04 DIAGNOSIS — Z780 Asymptomatic menopausal state: Secondary | ICD-10-CM | POA: Diagnosis not present

## 2024-02-04 DIAGNOSIS — Z23 Encounter for immunization: Secondary | ICD-10-CM

## 2024-02-04 DIAGNOSIS — N951 Menopausal and female climacteric states: Secondary | ICD-10-CM

## 2024-02-04 LAB — FOLLICLE STIMULATING HORMONE: FSH: 34 m[IU]/mL

## 2024-02-04 NOTE — Addendum Note (Signed)
 Addended by: Marigene Shoulder on: 02/04/2024 10:13 AM   Modules accepted: Orders

## 2024-02-04 NOTE — Progress Notes (Signed)
 Selena Chapman is a 53 y.o. female presents to the office today for the first shingrix vaccine per physician's orders.  Original order: 12/26/23 patient message. Shingrix 0.10mL, IM was administered in left deltoid today.  Pt tolerated injection well.  Pt declines to schedule next dose due to work situation and will call back to schedule 2nd dose in 2 to 6 months.   Akari Defelice, CMA (AAMA)

## 2024-02-06 ENCOUNTER — Encounter: Payer: Self-pay | Admitting: Family Medicine

## 2024-02-06 DIAGNOSIS — N951 Menopausal and female climacteric states: Secondary | ICD-10-CM

## 2024-02-07 MED ORDER — ESTRADIOL-PROGESTERONE 0.5-100 MG PO CAPS: 1.0000 | ORAL_CAPSULE | Freq: Every day | ORAL | 3 refills | Status: DC

## 2024-02-08 MED ORDER — ESTRADIOL 0.5 MG PO TABS: 0.5000 mg | ORAL_TABLET | Freq: Every day | ORAL | 1 refills | Status: DC

## 2024-02-08 MED ORDER — PROGESTERONE MICRONIZED 100 MG PO CAPS: 100.0000 mg | ORAL_CAPSULE | Freq: Every day | ORAL | 1 refills | Status: DC

## 2024-02-08 NOTE — Addendum Note (Signed)
 Addended by: Gates Kasal C on: 02/08/2024 04:45 PM   Modules accepted: Orders

## 2024-04-04 ENCOUNTER — Other Ambulatory Visit: Payer: Self-pay | Admitting: Family Medicine

## 2024-04-04 DIAGNOSIS — N951 Menopausal and female climacteric states: Secondary | ICD-10-CM

## 2024-05-05 DIAGNOSIS — F3181 Bipolar II disorder: Secondary | ICD-10-CM | POA: Diagnosis not present

## 2024-05-05 DIAGNOSIS — F411 Generalized anxiety disorder: Secondary | ICD-10-CM | POA: Diagnosis not present

## 2024-05-05 DIAGNOSIS — F4321 Adjustment disorder with depressed mood: Secondary | ICD-10-CM | POA: Diagnosis not present

## 2024-06-24 ENCOUNTER — Encounter: Payer: Self-pay | Admitting: Family Medicine

## 2024-06-28 ENCOUNTER — Other Ambulatory Visit: Payer: Self-pay | Admitting: Family Medicine

## 2024-06-28 DIAGNOSIS — N951 Menopausal and female climacteric states: Secondary | ICD-10-CM

## 2024-07-04 NOTE — Progress Notes (Unsigned)
 [facility] at [facility] [address], [address] [place], [place] [address] [phone]  Date:  07/07/2024   Name:  Selena Chapman   DOB:  05-20-1971   MRN:  [id number]  PCP:  Watt Harlene BROCKS, MD    Chief Complaint: No chief complaint on file.   History of Present Illness:  Selena Chapman is a 53 y.o. 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  History of Present Illness     Patient Active Problem List   Diagnosis Date Noted   Vitamin D  deficiency 11/18/2020   Physical exam, annual 06/03/2019   Bipolar disorder ([facility]) 06/03/2019   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   Acquired hypothyroidism 12/29/2017   Family history of colon cancer 12/29/2017   Cough 10/10/2017   History of asthma 10/10/2017   History of vitamin D  deficiency 08/11/2014   Hyperlipidemia 08/11/2014   Obesity 08/11/2014   Asthma 09/23/2011   Environmental allergies 09/23/2011   Depression     Past Medical History:  Diagnosis Date   Allergy    Anxiety    Asthma    Bipolar affect, depressed ([facility])    Bipolar disorder ([facility]) 06/03/2019   Depression    Physical exam, annual 06/03/2019   Plantar fasciitis     Past Surgical History:  Procedure Laterality Date   APPENDECTOMY      Social History   Tobacco Use    Smoking status: Never   Smokeless tobacco: Never  Substance Use Topics   Alcohol use: No   Drug use: No    Family History  Problem Relation Age of Onset   Hypertension Mother    Depression Sister     No Known Allergies  Medication list has been reviewed and updated.  Current Outpatient Medications on File Prior to Visit  Medication Sig Dispense Refill   albuterol  (VENTOLIN  HFA) 108 (90 Base) MCG/ACT inhaler Inhale 2 puffs into the lungs every 6 (six) hours as needed for wheezing or shortness of breath. 6.7 g 3   beclomethasone (QVAR  REDIHALER) 80 MCG/ACT inhaler Inhale 1 puff into the lungs 2 (two) times daily. 1 each 5   Budesonide  (PULMICORT  FLEXHALER) 90 MCG/ACT inhaler Inhale 1-2 puffs into the lungs 2 (two) times daily. 1 each 6   carbamazepine (TEGRETOL) 200 MG tablet Take 400 mg by mouth daily.     clonazePAM  (KLONOPIN ) 1 MG tablet Take 1 tablet (1 mg total) by mouth 3 (three) times daily as needed for anxiety. for anxiety (Patient taking differently: Take 1 mg by mouth as needed for anxiety. for anxiety) 90 tablet 1   D3-1000 25 MCG (1000 UT) capsule Take 1,000 Units by mouth daily.     estradiol  (ESTRACE ) 0.5 MG tablet Take 1 tablet (0.5 mg total) by mouth daily. 90 tablet 0   fexofenadine (ALLEGRA) 180 MG tablet Take 180  mg by mouth daily.     levothyroxine  (SYNTHROID ) 50 MCG tablet Take 1 tablet (50 mcg total) by mouth daily before breakfast. 90 tablet 3   progesterone  (PROMETRIUM ) 100 MG capsule Take 1 capsule (100 mg total) by mouth daily. 30 capsule 1   Vilazodone HCl 20 MG TABS Take 1 tablet by mouth daily.     No current facility-administered medications on file prior to visit.    Review of Systems:  As per HPI- otherwise negative.   Physical Examination: There were no vitals filed for this visit. There were no vitals filed for this visit. There is no height or weight on file to calculate BMI. Ideal Body Weight:    GEN: no acute distress. HEENT:  Atraumatic, Normocephalic.  Ears and Nose: No external deformity. CV: RRR, No M/G/R. No JVD. No thrill. No extra heart sounds. PULM: CTA B, no wheezes, crackles, rhonchi. No retractions. No resp. distress. No accessory muscle use. ABD: S, NT, ND, +BS. No rebound. No HSM. EXTR: No c/c/e PSYCH: Normally interactive. Conversant.    Assessment and Plan: No diagnosis found.  Assessment & Plan   Signed Harlene Schroeder, MD

## 2024-07-07 ENCOUNTER — Ambulatory Visit (INDEPENDENT_AMBULATORY_CARE_PROVIDER_SITE_OTHER): Admitting: Family Medicine

## 2024-07-07 VITALS — BP 126/72 | HR 58 | Ht 63.5 in | Wt 177.8 lb

## 2024-07-07 DIAGNOSIS — Z23 Encounter for immunization: Secondary | ICD-10-CM

## 2024-07-07 DIAGNOSIS — J309 Allergic rhinitis, unspecified: Secondary | ICD-10-CM | POA: Diagnosis not present

## 2024-07-07 DIAGNOSIS — Z111 Encounter for screening for respiratory tuberculosis: Secondary | ICD-10-CM

## 2024-07-07 NOTE — Patient Instructions (Signed)
 I will be in touch with your tb test result asap  Take care!

## 2024-07-09 LAB — QUANTIFERON-TB GOLD PLUS
Mitogen-NIL: 8.11 [IU]/mL
NIL: 0.02 [IU]/mL
QuantiFERON-TB Gold Plus: NEGATIVE
TB1-NIL: 0.03 [IU]/mL
TB2-NIL: 0 [IU]/mL

## 2024-07-10 ENCOUNTER — Ambulatory Visit: Payer: Self-pay | Admitting: Family Medicine

## 2024-08-15 ENCOUNTER — Other Ambulatory Visit: Payer: Self-pay | Admitting: Family Medicine

## 2024-08-15 DIAGNOSIS — N951 Menopausal and female climacteric states: Secondary | ICD-10-CM
